# Patient Record
Sex: Female | Born: 1965 | Race: Black or African American | Hispanic: No | Marital: Single | Smoking: Current every day smoker
Health system: Southern US, Community
[De-identification: ages and names within clinical notes are randomized; demographics above are authoritative.]

## PROBLEM LIST (undated history)

## (undated) DIAGNOSIS — J45909 Unspecified asthma, uncomplicated: Secondary | ICD-10-CM

## (undated) DIAGNOSIS — E785 Hyperlipidemia, unspecified: Secondary | ICD-10-CM

## (undated) DIAGNOSIS — J449 Chronic obstructive pulmonary disease, unspecified: Secondary | ICD-10-CM

## (undated) DIAGNOSIS — I10 Essential (primary) hypertension: Secondary | ICD-10-CM

## (undated) DIAGNOSIS — J302 Other seasonal allergic rhinitis: Secondary | ICD-10-CM

## (undated) HISTORY — DX: Hyperlipidemia, unspecified: E78.5

## (undated) HISTORY — DX: Chronic obstructive pulmonary disease, unspecified: J44.9

## (undated) HISTORY — PX: NO PAST SURGERIES: SHX2092

## (undated) HISTORY — DX: Unspecified asthma, uncomplicated: J45.909

---

## 2019-02-22 ENCOUNTER — Other Ambulatory Visit: Payer: Self-pay

## 2019-02-22 ENCOUNTER — Emergency Department (HOSPITAL_COMMUNITY)
Admission: EM | Admit: 2019-02-22 | Discharge: 2019-02-23 | Disposition: A | Discharge status: Home / Self Care | Payer: Self-pay | Attending: Emergency Medicine | Admitting: Emergency Medicine

## 2019-02-22 ENCOUNTER — Emergency Department (HOSPITAL_COMMUNITY): Payer: Self-pay

## 2019-02-22 ENCOUNTER — Encounter (HOSPITAL_COMMUNITY): Payer: Self-pay | Admitting: Emergency Medicine

## 2019-02-22 DIAGNOSIS — R079 Chest pain, unspecified: Secondary | ICD-10-CM | POA: Insufficient documentation

## 2019-02-22 DIAGNOSIS — F17210 Nicotine dependence, cigarettes, uncomplicated: Secondary | ICD-10-CM | POA: Insufficient documentation

## 2019-02-22 LAB — BASIC METABOLIC PANEL
Anion gap: 11 (ref 5–15)
BUN: 13 mg/dL (ref 6–20)
CO2: 18 mmol/L — ABNORMAL LOW (ref 22–32)
Calcium: 8.2 mg/dL — ABNORMAL LOW (ref 8.9–10.3)
Chloride: 107 mmol/L (ref 98–111)
Creatinine, Ser: 0.86 mg/dL (ref 0.44–1.00)
GFR calc Af Amer: >60 mL/min (ref >60)
GFR calc non Af Amer: >60 mL/min (ref >60)
Glucose, Bld: 137 mg/dL — ABNORMAL HIGH (ref 70–99)
Potassium: 4.1 mmol/L (ref 3.5–5.1)
Sodium: 136 mmol/L (ref 135–145)

## 2019-02-22 LAB — CBC
HCT: 45.8 % (ref 36.0–46.0)
Hemoglobin: 14.5 g/dL (ref 12.0–15.0)
MCH: 25.1 pg — ABNORMAL LOW (ref 26.0–34.0)
MCHC: 31.7 g/dL (ref 30.0–36.0)
MCV: 79.2 fL — ABNORMAL LOW (ref 80.0–100.0)
Platelets: 256 10*3/uL (ref 150–400)
RBC: 5.78 MIL/uL — ABNORMAL HIGH (ref 3.87–5.11)
RDW: 14.2 % (ref 11.5–15.5)
WBC: 7.5 10*3/uL (ref 4.0–10.5)
nRBC: 0 % (ref 0.0–0.2)

## 2019-02-22 LAB — TROPONIN I (HIGH SENSITIVITY): Troponin I (High Sensitivity): 4 ng/L (ref <18)

## 2019-02-22 LAB — I-STAT BETA HCG BLOOD, ED (MC, WL, AP ONLY): I-stat hCG, quantitative: <5 m[IU]/mL (ref <5)

## 2019-02-22 MED ORDER — SODIUM CHLORIDE 0.9% FLUSH: 3.0000 mL | Freq: Once | INTRAVENOUS | Status: DC

## 2019-02-22 NOTE — ED Triage Notes (Signed)
Pt in with c/o L side cp, worse since this am. States she is being treated currently for bronchitis with inhaler. Endorses cough and n/v, denies fevers. Pain "soreness" when pressed

## 2019-02-22 NOTE — ED Notes (Signed)
Pt c/o headache. Pt informed medication cannot be given prior to being roomed. Pt had no further questions.

## 2019-02-23 ENCOUNTER — Other Ambulatory Visit: Payer: Self-pay

## 2019-02-23 MED ORDER — LISINOPRIL-HYDROCHLOROTHIAZIDE 20-12.5 MG PO TABS: 1.0000 | ORAL_TABLET | Freq: Every day | ORAL | 0 refills | Status: DC

## 2019-02-23 MED ORDER — OMEPRAZOLE 20 MG PO CPDR: 20.0000 mg | DELAYED_RELEASE_CAPSULE | Freq: Every day | ORAL | 0 refills | Status: DC

## 2019-02-23 NOTE — ED Notes (Signed)
Pt discharged from ED; instructions provided and scripts given; Pt encouraged to return to ED if symptoms worsen and to f/u with PCP; Pt verbalized understanding of all instructions 

## 2019-02-23 NOTE — ED Provider Notes (Signed)
[facility] EMERGENCY DEPARTMENT Provider Note   CSN: [id number] Arrival date & time: 02/22/19  1743     History   Chief Complaint Chief Complaint  Patient presents with  . Bronchitis  . Cough  . Chest Pain    HPI Carrie Potts is a 53 y.o. female.     Patient to ED with complaint of pain in her left chest x 3 days. She describes pain that comes and goes, felt more when she moves or is active. She is using an inhaler and nebulizer for treatment of chronic bronchitis and is using these medications as per her usual without any complaint of new or worsening shortness of breath. No cough, fever, nausea, diaphoresis. The pain in her chest does not radiate.   The history is provided by the patient. No language interpreter was used.  Cough Associated symptoms: chest pain   Associated symptoms: no chills, no fever and no shortness of breath   Chest Pain Associated symptoms: no abdominal pain, no cough, no fever, no nausea and no shortness of breath     History reviewed. No pertinent past medical history.  There are no active problems to display for this patient.   History reviewed. No pertinent surgical history.   OB History   No obstetric history on file.      Home Medications    Prior to Admission medications   Not on File    Family History No family history on file.  Social History Social History   Tobacco Use  . Smoking status: Current Every Day Smoker    Packs/day: 0.50  . Smokeless tobacco: Never Used  Substance Use Topics  . Alcohol use: Not Currently  . Drug use: Never     Allergies   Patient has no allergy information on record.   Review of Systems Review of Systems  Constitutional: Negative for chills and fever.  HENT: Negative.   Respiratory: Negative for cough and shortness of breath.   Cardiovascular: Positive for chest pain.  Gastrointestinal: Negative.  Negative for abdominal pain and nausea.   Musculoskeletal: Negative.   Skin: Negative.   Neurological: Negative.  Negative for light-headedness.     Physical Exam Updated Vital Signs BP 132/88   Pulse 78   Temp 98.3 F (36.8 C) (Oral)   Resp 17   Ht 5\' 2"  (1.575 m)   Wt 79.8 kg   SpO2 100%   BMI 32.19 kg/m   Physical Exam Constitutional:      Appearance: She is well-developed.  HENT:     Head: Normocephalic.  Neck:     Musculoskeletal: Normal range of motion and neck supple.  Cardiovascular:     Rate and Rhythm: Normal rate and regular rhythm.  Pulmonary:     Effort: Pulmonary effort is normal.     Breath sounds: Normal breath sounds. No wheezing, rhonchi or rales.  Chest:     Chest wall: Tenderness (Tenderness that reproduces pain of complaint in left chest. ) present.  Abdominal:     General: Bowel sounds are normal.     Palpations: Abdomen is soft.     Tenderness: There is no abdominal tenderness. There is no guarding or rebound.  Musculoskeletal: Normal range of motion.  Skin:    General: Skin is warm and dry.     Findings: No rash.  Neurological:     General: No focal deficit present.     Mental Status: She is alert and oriented to person,  place, and time.      ED Treatments / Results  Labs (all labs ordered are listed, but only abnormal results are displayed) Labs Reviewed  BASIC METABOLIC PANEL - Abnormal; Notable for the following components:      Result Value   CO2 18 (*)    Glucose, Bld 137 (*)    Calcium 8.2 (*)    All other components within normal limits  CBC - Abnormal; Notable for the following components:   RBC 5.78 (*)    MCV 79.2 (*)    MCH 25.1 (*)    All other components within normal limits  I-STAT BETA HCG BLOOD, ED ([facility], [facility], [facility] ONLY)  TROPONIN I (HIGH SENSITIVITY)  TROPONIN I (HIGH SENSITIVITY)    EKG None  Radiology Dg Chest 2 View  Result Date: 02/22/2019 CLINICAL DATA:  Pt in with c/o L side cp, worse since this am. States she is being treated currently for  bronchitis with inhaler. Endorses cough and n/v, denies fevers. EXAM: CHEST - 2 VIEW COMPARISON:  None. FINDINGS: The heart size and mediastinal contours are within normal limits. The lungs are clear. No pneumothorax or pleural effusion. The visualized skeletal structures are unremarkable. IMPRESSION: No active cardiopulmonary disease. Electronically Signed   By: Audie Pinto M.D.   On: 02/22/2019 19:00    Procedures Procedures (including critical care time)  Medications Ordered in ED Medications  sodium chloride flush (NS) 0.9 % injection 3 mL (has no administration in time range)     Initial Impression / Assessment and Plan / ED Course  I have reviewed the triage vital signs and the nursing notes.  Pertinent labs & imaging results that were available during my care of the patient were reviewed by me and considered in my medical decision making (see chart for details).        Patient to ED with 3 days of chest pain in left chest, worse with movement/activity, no SOB, nausea, cough, fever.   She is very well appearing. Labs are unremarkable including troponin. CXR clear. EKG NSR without ischemia. Pain x 3 days that is atypical for ACS. Delta troponin is not felt necessary for full evaluation.   Pain is reproducible. GI source still on the differential given left sided pain, however, less likely.   Discussed trial of Prilosec and PCP follow up if pain continues. She is requesting refill of her Zestoretic which we will do for a 30 day supply. Will refer to primary care clinic as she is new to the area.   Patient is felt appropriate for discharge home.   Final Clinical Impressions(s) / ED Diagnoses   Final diagnoses:  None   1. Nonspecific chest pain  ED Discharge Orders    None       Charlann Lange, PA-C 02/23/19 0543    Merryl Hacker, MD 02/24/19 [phone]

## 2019-05-13 HISTORY — PX: COLONOSCOPY: SHX174

## 2019-05-18 ENCOUNTER — Other Ambulatory Visit: Payer: Self-pay

## 2019-05-18 ENCOUNTER — Encounter (INDEPENDENT_AMBULATORY_CARE_PROVIDER_SITE_OTHER): Payer: Self-pay | Admitting: Primary Care

## 2019-05-18 ENCOUNTER — Ambulatory Visit (INDEPENDENT_AMBULATORY_CARE_PROVIDER_SITE_OTHER): Payer: BC Managed Care – PPO | Admitting: Primary Care

## 2019-05-18 VITALS — BP 158/90 | HR 85 | Temp 97.3°F | Ht 62.0 in | Wt 179.0 lb

## 2019-05-18 DIAGNOSIS — M545 Low back pain, unspecified: Secondary | ICD-10-CM

## 2019-05-18 DIAGNOSIS — I10 Essential (primary) hypertension: Secondary | ICD-10-CM | POA: Diagnosis not present

## 2019-05-18 DIAGNOSIS — Z1211 Encounter for screening for malignant neoplasm of colon: Secondary | ICD-10-CM

## 2019-05-18 DIAGNOSIS — Z13220 Encounter for screening for lipoid disorders: Secondary | ICD-10-CM | POA: Diagnosis not present

## 2019-05-18 DIAGNOSIS — E785 Hyperlipidemia, unspecified: Secondary | ICD-10-CM

## 2019-05-18 DIAGNOSIS — F17200 Nicotine dependence, unspecified, uncomplicated: Secondary | ICD-10-CM

## 2019-05-18 DIAGNOSIS — Z114 Encounter for screening for human immunodeficiency virus [HIV]: Secondary | ICD-10-CM

## 2019-05-18 DIAGNOSIS — Z23 Encounter for immunization: Secondary | ICD-10-CM | POA: Diagnosis not present

## 2019-05-18 DIAGNOSIS — Z1231 Encounter for screening mammogram for malignant neoplasm of breast: Secondary | ICD-10-CM

## 2019-05-18 DIAGNOSIS — G8929 Other chronic pain: Secondary | ICD-10-CM

## 2019-05-18 MED ORDER — SIMVASTATIN 10 MG PO TABS: 10.0000 mg | ORAL_TABLET | Freq: Every day | ORAL | 1 refills | Status: DC

## 2019-05-18 MED ORDER — LISINOPRIL-HYDROCHLOROTHIAZIDE 20-25 MG PO TABS: 1.0000 | ORAL_TABLET | Freq: Every day | ORAL | 3 refills | Status: DC

## 2019-05-18 MED ORDER — GABAPENTIN 600 MG PO TABS: 600.0000 mg | ORAL_TABLET | Freq: Three times a day (TID) | ORAL | 3 refills | Status: DC

## 2019-05-18 MED ORDER — LORATADINE 10 MG PO TABS: 10.0000 mg | ORAL_TABLET | Freq: Every day | ORAL | 3 refills | Status: DC

## 2019-05-18 NOTE — Progress Notes (Signed)
Established Patient Office Visit  Subjective:  Patient ID: Carrie Potts, female    DOB: 03-28-1966  Age: 54 y.o. MRN: [id number]  CC:  Chief Complaint  Patient presents with  . New Patient (Initial Visit)    HTN/eczema   . Medication Refill    HPI Carrie Potts presents for  Establishment of care and the management of hypertension , hyperlipidemia and bronchitis . She denies shortness of breath, headaches, chest pain or lower extremity edema. Voices concerns about eczema on her face.  History reviewed. No pertinent past medical history.  History reviewed. No pertinent surgical history.  History reviewed. No pertinent family history.  Social History   Socioeconomic History  . Marital status: Single    Spouse name: Not on file  . Number of children: Not on file  . Years of education: Not on file  . Highest education level: Not on file  Occupational History  . Not on file  Tobacco Use  . Smoking status: Current Every Day Smoker    Packs/day: 0.50  . Smokeless tobacco: Never Used  Substance and Sexual Activity  . Alcohol use: Not Currently  . Drug use: Never  . Sexual activity: Not on file  Other Topics Concern  . Not on file  Social History Narrative  . Not on file   Social Determinants of Health   Financial Resource Strain:   . Difficulty of Paying Living Expenses: Not on file  Food Insecurity:   . Worried About Charity fundraiser in the Last Year: Not on file  . Ran Out of Food in the Last Year: Not on file  Transportation Needs:   . Lack of Transportation (Medical): Not on file  . Lack of Transportation (Non-Medical): Not on file  Physical Activity:   . Days of Exercise per Week: Not on file  . Minutes of Exercise per Session: Not on file  Stress:   . Feeling of Stress : Not on file  Social Connections:   . Frequency of Communication with Friends and Family: Not on file  . Frequency of Social Gatherings with Friends and Family: Not  on file  . Attends Religious Services: Not on file  . Active Member of Clubs or Organizations: Not on file  . Attends Archivist Meetings: Not on file  . Marital Status: Not on file  Intimate Partner Violence:   . Fear of Current or Ex-Partner: Not on file  . Emotionally Abused: Not on file  . Physically Abused: Not on file  . Sexually Abused: Not on file    Outpatient Medications Prior to Visit  Medication Sig Dispense Refill  . gabapentin (NEURONTIN) 600 MG tablet Take 600 mg by mouth 3 (three) times daily. Take 1 tablet in the morning, 1 tablet in the evening and 2 tablets in the evening for back pain    . lisinopril-hydrochlorothiazide (ZESTORETIC) 20-12.5 MG tablet Take 1 tablet by mouth daily. 30 tablet 0  . loratadine (CLARITIN) 10 MG tablet Take 10 mg by mouth at bedtime.    . simvastatin (ZOCOR) 10 MG tablet Take 10 mg by mouth daily.    Marland Kitchen omeprazole (PRILOSEC) 20 MG capsule Take 1 capsule (20 mg total) by mouth daily. 14 capsule 0   No facility-administered medications prior to visit.    No Known Allergies  ROS Review of Systems  Respiratory: Positive for shortness of breath and wheezing.        With exertion   Musculoskeletal: Positive  for back pain and joint swelling.       Hands  All other systems reviewed and are negative.     Objective:    Physical Exam  Constitutional: She is oriented to person, place, and time. She appears well-developed and well-nourished.  HENT:  Head: Normocephalic.  Eyes: Pupils are equal, round, and reactive to light. EOM are normal.  Cardiovascular: Normal rate and regular rhythm.  Pulmonary/Chest: Effort normal and breath sounds normal.  Abdominal: Soft. Bowel sounds are normal.  Musculoskeletal:        General: Normal range of motion.     Cervical back: Normal range of motion and neck supple.  Neurological: She is oriented to person, place, and time. She has normal reflexes.  Skin: Skin is warm and dry.   Psychiatric: She has a normal mood and affect. Her behavior is normal.    BP (!) 158/90 (BP Location: Right Arm, Patient Position: Sitting, Cuff Size: Normal)   Pulse 85   Temp (!) 97.3 F (36.3 C) (Temporal)   Ht _0  (1.575 m)   Wt 179 lb (81.2 kg)   SpO2 98%   BMI 32.74 kg/m  Wt Readings from Last 3 Encounters:  05/18/19 179 lb (81.2 kg)  02/23/19 176 lb (79.8 kg)     Health Maintenance Due  Topic Date Due  . PAP SMEAR-Modifier  08/04/1988  . MAMMOGRAM  08/04/2017  . COLONOSCOPY  08/04/2017    There are no preventive care reminders to display for this patient.  No results found for: TSH Lab Results  Component Value Date   WBC 6.3 05/18/2019   HGB 13.7 05/18/2019   HCT 43.0 05/18/2019   MCV 78 (L) 05/18/2019   PLT 292 05/18/2019   Lab Results  Component Value Date   NA 139 05/18/2019   K 3.9 05/18/2019   CO2 20 05/18/2019   GLUCOSE 87 05/18/2019   BUN 15 05/18/2019   CREATININE 0.65 05/18/2019   BILITOT <0.2 05/18/2019   ALKPHOS 69 05/18/2019   AST 23 05/18/2019   ALT 24 05/18/2019   PROT 6.2 05/18/2019   ALBUMIN 4.2 05/18/2019   CALCIUM 9.2 05/18/2019   ANIONGAP 11 02/22/2019   Lab Results  Component Value Date   CHOL 187 05/18/2019   Lab Results  Component Value Date   HDL 54 05/18/2019   Lab Results  Component Value Date   LDLCALC 97 05/18/2019   Lab Results  Component Value Date   TRIG 210 (H) 05/18/2019   Lab Results  Component Value Date   CHOLHDL 3.5 05/18/2019   No results found for: HGBA1C    Assessment & Plan:  Carrie Potts was seen today for new patient (initial visit) and medication refill.  Diagnoses and all orders for this visit:  Need for Tdap vaccination -     Tdap vaccine greater than or equal to 7yo IM  Encounter for screening for HIV -     HIV antibody (with reflex)  Screening mammogram, encounter for -     MM Digital Screening; Future  Encounter for screening for malignant neoplasm of colon -     Cancel: CT  VIRTUAL COLONOSCOPY DIAGNOSTIC; Future -     Ambulatory referral to Gastroenterology  Essential hypertension Counseled on blood pressure goal of less than 130/80, low-sodium, DASH diet, medication compliance, 150 minutes of moderate intensity exercise per week. Discussed medication compliance, adverse effects. -     CBC with Differential -     CMP14+EGFR  Hyperlipidemia,  unspecified hyperlipidemia type Elevated cholesterol that can lead to heart attack and stroke.Decrease your fatty foods, red meat, cheese, milk and increase fiber like whole grains and veggies.    Lipid Panel  Chronic bilateral low back pain, unspecified whether sciatica present gabapentin (NEURONTIN) 600 MG tablet; Take 1 tablet (600 mg total) by mouth 3 (three) times daily. Take 1 tablet in the morning, 1 tablet in the evening and 2 tablets in the evening for back pain  Need for immunization against influenza -     Flu Vaccine QUAD 36+ mos IM  Other orders -     lisinopril-hydrochlorothiazide (ZESTORETIC) 20-25 MG tablet; Take 1 tablet by mouth daily. -     loratadine (CLARITIN) 10 MG tablet; Take 1 tablet (10 mg total) by mouth at bedtime. -     Discontinue: simvastatin (ZOCOR) 10 MG tablet; Take 1 tablet (10 mg total) by mouth daily. -       Meds ordered this encounter  Medications  . lisinopril-hydrochlorothiazide (ZESTORETIC) 20-25 MG tablet    Sig: Take 1 tablet by mouth daily.    Dispense:  90 tablet    Refill:  3  . loratadine (CLARITIN) 10 MG tablet    Sig: Take 1 tablet (10 mg total) by mouth at bedtime.    Dispense:  90 tablet    Refill:  3  . DISCONTD: simvastatin (ZOCOR) 10 MG tablet    Sig: Take 1 tablet (10 mg total) by mouth daily.    Dispense:  90 tablet    Refill:  1  . gabapentin (NEURONTIN) 600 MG tablet    Sig: Take 1 tablet (600 mg total) by mouth 3 (three) times daily. Take 1 tablet in the morning, 1 tablet in the evening and 2 tablets in the evening for back pain    Dispense:  150  tablet    Refill:  3    Follow-up: Return in 3 weeks (on 06/08/2019) for 3 weeks tele visit for blood pressure .    Kerin Perna, NP

## 2019-05-18 NOTE — Patient Instructions (Signed)
   Managing Your Hypertension Hypertension is commonly called high blood pressure. This is when the force of your blood pressing against the walls of your arteries is too strong. Arteries are blood vessels that carry blood from your heart throughout your body. Hypertension forces the heart to work harder to pump blood, and may cause the arteries to become narrow or stiff. Having untreated or uncontrolled hypertension can cause heart attack, stroke, kidney disease, and other problems. What are blood pressure readings? A blood pressure reading consists of a higher number over a lower number. Ideally, your blood pressure should be below 120/80. The first ("top") number is called the systolic pressure. It is a measure of the pressure in your arteries as your heart beats. The second ("bottom") number is called the diastolic pressure. It is a measure of the pressure in your arteries as the heart relaxes. What does my blood pressure reading mean? Blood pressure is classified into four stages. Based on your blood pressure reading, your health care provider may use the following stages to determine what type of treatment you need, if any. Systolic pressure and diastolic pressure are measured in a unit called mm Hg. Normal  Systolic pressure: below 120.  Diastolic pressure: below 80. Elevated  Systolic pressure: 120-129.  Diastolic pressure: below 80. Hypertension stage 1  Systolic pressure: 130-139.  Diastolic pressure: 80-89. Hypertension stage 2  Systolic pressure: 140 or above.  Diastolic pressure: 90 or above. What health risks are associated with hypertension? Managing your hypertension is an important responsibility. Uncontrolled hypertension can lead to:  A heart attack.  A stroke.  A weakened blood vessel (aneurysm).  Heart failure.  Kidney damage.  Eye damage.  Metabolic syndrome.  Memory and concentration problems. What changes can I make to manage my  hypertension? Hypertension can be managed by making lifestyle changes and possibly by taking medicines. Your health care provider will help you make a plan to bring your blood pressure within a normal range. Eating and drinking   Eat a diet that is high in fiber and potassium, and low in salt (sodium), added sugar, and fat. An example eating plan is called the DASH (Dietary Approaches to Stop Hypertension) diet. To eat this way: ? Eat plenty of fresh fruits and vegetables. Try to fill half of your plate at each meal with fruits and vegetables. ? Eat whole grains, such as whole wheat pasta, brown rice, or whole grain bread. Fill about one quarter of your plate with whole grains. ? Eat low-fat diary products. ? Avoid fatty cuts of meat, processed or cured meats, and poultry with skin. Fill about one quarter of your plate with lean proteins such as fish, chicken without skin, beans, eggs, and tofu. ? Avoid premade and processed foods. These tend to be higher in sodium, added sugar, and fat.  Reduce your daily sodium intake. Most people with hypertension should eat less than 1,500 mg of sodium a day.  Limit alcohol intake to no more than 1 drink a day for nonpregnant women and 2 drinks a day for men. One drink equals 12 oz of beer, 5 oz of wine, or 1 oz of hard liquor. Lifestyle  Work with your health care provider to maintain a healthy body weight, or to lose weight. Ask what an ideal weight is for you.  Get at least 30 minutes of exercise that causes your heart to beat faster (aerobic exercise) most days of the week. Activities may include walking, swimming, or biking.    Include exercise to strengthen your muscles (resistance exercise), such as weight lifting, as part of your weekly exercise routine. Try to do these types of exercises for 30 minutes at least 3 days a week.  Do not use any products that contain nicotine or tobacco, such as cigarettes and e-cigarettes. If you need help quitting,  ask your health care provider.  Control any long-term (chronic) conditions you have, such as high cholesterol or diabetes. Monitoring  Monitor your blood pressure at home as told by your health care provider. Your personal target blood pressure may vary depending on your medical conditions, your age, and other factors.  Have your blood pressure checked regularly, as often as told by your health care provider. Working with your health care provider  Review all the medicines you take with your health care provider because there may be side effects or interactions.  Talk with your health care provider about your diet, exercise habits, and other lifestyle factors that may be contributing to hypertension.  Visit your health care provider regularly. Your health care provider can help you create and adjust your plan for managing hypertension. Will I need medicine to control my blood pressure? Your health care provider may prescribe medicine if lifestyle changes are not enough to get your blood pressure under control, and if:  Your systolic blood pressure is 130 or higher.  Your diastolic blood pressure is 80 or higher. Take medicines only as told by your health care provider. Follow the directions carefully. Blood pressure medicines must be taken as prescribed. The medicine does not work as well when you skip doses. Skipping doses also puts you at risk for problems. Contact a health care provider if:  You think you are having a reaction to medicines you have taken.  You have repeated (recurrent) headaches.  You feel dizzy.  You have swelling in your ankles.  You have trouble with your vision. Get help right away if:  You develop a severe headache or confusion.  You have unusual weakness or numbness, or you feel faint.  You have severe pain in your chest or abdomen.  You vomit repeatedly.  You have trouble breathing. Summary  Hypertension is when the force of blood pumping  through your arteries is too strong. If this condition is not controlled, it may put you at risk for serious complications.  Your personal target blood pressure may vary depending on your medical conditions, your age, and other factors. For most people, a normal blood pressure is less than 120/80.  Hypertension is managed by lifestyle changes, medicines, or both. Lifestyle changes include weight loss, eating a healthy, low-sodium diet, exercising more, and limiting alcohol. This information is not intended to replace advice given to you by your health care provider. Make sure you discuss any questions you have with your health care provider. Document Revised: 08/20/2018 Document Reviewed: 03/26/2016 Elsevier Patient Education  2020 [employer]  

## 2019-05-19 ENCOUNTER — Other Ambulatory Visit (INDEPENDENT_AMBULATORY_CARE_PROVIDER_SITE_OTHER): Payer: Self-pay | Admitting: Primary Care

## 2019-05-19 ENCOUNTER — Telehealth (INDEPENDENT_AMBULATORY_CARE_PROVIDER_SITE_OTHER): Payer: Self-pay

## 2019-05-19 LAB — CBC WITH DIFFERENTIAL/PLATELET
Basophils Absolute: 0.1 10*3/uL (ref 0.0–0.2)
Basos: 1 %
EOS (ABSOLUTE): 0.1 10*3/uL (ref 0.0–0.4)
Eos: 2 %
Hematocrit: 43 % (ref 34.0–46.6)
Hemoglobin: 13.7 g/dL (ref 11.1–15.9)
Immature Grans (Abs): 0 10*3/uL (ref 0.0–0.1)
Immature Granulocytes: 0 %
Lymphocytes Absolute: 2.4 10*3/uL (ref 0.7–3.1)
Lymphs: 39 %
MCH: 24.9 pg — ABNORMAL LOW (ref 26.6–33.0)
MCHC: 31.9 g/dL (ref 31.5–35.7)
MCV: 78 fL — ABNORMAL LOW (ref 79–97)
Monocytes Absolute: 0.5 10*3/uL (ref 0.1–0.9)
Monocytes: 7 %
Neutrophils Absolute: 3.2 10*3/uL (ref 1.4–7.0)
Neutrophils: 51 %
Platelets: 292 10*3/uL (ref 150–450)
RBC: 5.51 x10E6/uL — ABNORMAL HIGH (ref 3.77–5.28)
RDW: 13.3 % (ref 11.7–15.4)
WBC: 6.3 10*3/uL (ref 3.4–10.8)

## 2019-05-19 LAB — HIV ANTIBODY (ROUTINE TESTING W REFLEX): HIV Screen 4th Generation wRfx: NONREACTIVE

## 2019-05-19 LAB — CMP14+EGFR
ALT: 24 IU/L (ref 0–32)
AST: 23 IU/L (ref 0–40)
Albumin/Globulin Ratio: 2.1 (ref 1.2–2.2)
Albumin: 4.2 g/dL (ref 3.8–4.9)
Alkaline Phosphatase: 69 IU/L (ref 39–117)
BUN/Creatinine Ratio: 23 (ref 9–23)
BUN: 15 mg/dL (ref 6–24)
Bilirubin Total: <0.2 mg/dL (ref 0.0–1.2)
CO2: 20 mmol/L (ref 20–29)
Calcium: 9.2 mg/dL (ref 8.7–10.2)
Chloride: 105 mmol/L (ref 96–106)
Creatinine, Ser: 0.65 mg/dL (ref 0.57–1.00)
GFR calc Af Amer: 119 mL/min/{1.73_m2} (ref >59)
GFR calc non Af Amer: 103 mL/min/{1.73_m2} (ref >59)
Globulin, Total: 2 g/dL (ref 1.5–4.5)
Glucose: 87 mg/dL (ref 65–99)
Potassium: 3.9 mmol/L (ref 3.5–5.2)
Sodium: 139 mmol/L (ref 134–144)
Total Protein: 6.2 g/dL (ref 6.0–8.5)

## 2019-05-19 LAB — LIPID PANEL
Chol/HDL Ratio: 3.5 ratio (ref 0.0–4.4)
Cholesterol, Total: 187 mg/dL (ref 100–199)
HDL: 54 mg/dL (ref >39)
LDL Chol Calc (NIH): 97 mg/dL (ref 0–99)
Triglycerides: 210 mg/dL — ABNORMAL HIGH (ref 0–149)
VLDL Cholesterol Cal: 36 mg/dL (ref 5–40)

## 2019-05-19 MED ORDER — SIMVASTATIN 20 MG PO TABS: 20.0000 mg | ORAL_TABLET | Freq: Every day | ORAL | 1 refills | Status: DC

## 2019-05-19 NOTE — Telephone Encounter (Signed)
Patient is aware of Elevated Triglycerides increased simvastatin 20mg  at bedtime. Sent in new prescriptions. Take 2 pills until current bottle is empty. Other labs reviewed normal and HIV negative. She verbalized understanding of results. Nat Christen, CMA

## 2019-05-19 NOTE — Telephone Encounter (Signed)
-----   Message from Kerin Perna, NP sent at 05/19/2019  9:20 AM EST ----- Elevated Triglycerides increased simvastatin 20mg  at bedtime. Sent in new prescriptions. Take 2 pills until current bottle is empty. Other labs reviewed normal and HIV negative

## 2019-06-01 ENCOUNTER — Other Ambulatory Visit (INDEPENDENT_AMBULATORY_CARE_PROVIDER_SITE_OTHER): Payer: Self-pay | Admitting: Primary Care

## 2019-06-01 MED ORDER — GABAPENTIN 600 MG PO TABS: ORAL_TABLET | ORAL | 3 refills | Status: DC

## 2019-06-08 ENCOUNTER — Other Ambulatory Visit: Payer: Self-pay

## 2019-06-08 ENCOUNTER — Ambulatory Visit (INDEPENDENT_AMBULATORY_CARE_PROVIDER_SITE_OTHER): Payer: BC Managed Care – PPO | Admitting: Primary Care

## 2019-06-08 ENCOUNTER — Encounter (INDEPENDENT_AMBULATORY_CARE_PROVIDER_SITE_OTHER): Payer: Self-pay | Admitting: Primary Care

## 2019-06-08 DIAGNOSIS — F17200 Nicotine dependence, unspecified, uncomplicated: Secondary | ICD-10-CM | POA: Diagnosis not present

## 2019-06-08 DIAGNOSIS — I10 Essential (primary) hypertension: Secondary | ICD-10-CM | POA: Diagnosis not present

## 2019-06-08 DIAGNOSIS — J9801 Acute bronchospasm: Secondary | ICD-10-CM

## 2019-06-08 MED ORDER — ALBUTEROL SULFATE HFA 108 (90 BASE) MCG/ACT IN AERS: 2.0000 | INHALATION_SPRAY | Freq: Four times a day (QID) | RESPIRATORY_TRACT | 1 refills | Status: DC | PRN

## 2019-06-08 MED ORDER — AMLODIPINE BESYLATE 10 MG PO TABS: 10.0000 mg | ORAL_TABLET | Freq: Every day | ORAL | 1 refills | Status: DC

## 2019-06-08 NOTE — Progress Notes (Signed)
Pt can not find her BP cuff to check her pressure  Pt states claritin is not working

## 2019-06-08 NOTE — Progress Notes (Signed)
Virtual Visit via Telephone Note  I connected with Carrie Potts on 06/08/19 at  8:50 AM EST by telephone and verified that I am speaking with the correct person using two identifiers.   I discussed the limitations, risks, security and privacy concerns of performing an evaluation and management service by telephone and the availability of in person appointments. I also discussed with the patient that there may be a patient responsible charge related to this service. The patient expressed understanding and agreed to proceed.   History of Present Illness: Carrie Potts is having a tele visit first time taken Bp today 149/97 , HR 79 unable to review ranges.   No past medical history on file.  Current Outpatient Medications on File Prior to Visit  Medication Sig Dispense Refill  . gabapentin (NEURONTIN) 600 MG tablet Take 1 tablet at breakfast 1 tablet at noon and 2 tablets at bedtime 120 tablet 3  . lisinopril-hydrochlorothiazide (ZESTORETIC) 20-25 MG tablet Take 1 tablet by mouth daily. 90 tablet 3  . loratadine (CLARITIN) 10 MG tablet Take 1 tablet (10 mg total) by mouth at bedtime. 90 tablet 3  . simvastatin (ZOCOR) 20 MG tablet Take 1 tablet (20 mg total) by mouth daily. 90 tablet 1   No current facility-administered medications on file prior to visit.   Observations/Objective: Review of Systems  Respiratory: Positive for cough, sputum production, shortness of breath and wheezing.   All other systems reviewed and are negative.   Assessment and Plan: Carrie Potts was seen today for blood pressure check.  Diagnoses and all orders for this visit:  Essential hypertension Uncontrolled elevated today only reading available takes medication daily. Added amlodipin 10 mg daily and continue lisinopril/HCTZ 20/25   Tobacco dependence She was made aware of increased risk for lung cancer and other respiratory diseases recommend cessation. Also increasing bronchitis  exacerbation/bronchospasm This will be reminded at each clinical visit.  Bronchospasm Productive Coughing , short of breath and wheezing emphasize smoking is coughing increase exacerbation . Prescribed a rescue inhaler and instruction when and hough to use  Other orders -     amLODipine (NORVASC) 10 MG tablet; Take 1 tablet (10 mg total) by mouth daily. -     albuterol (VENTOLIN HFA) 108 (90 Base) MCG/ACT inhaler; Inhale 2 puffs into the lungs every 6 (six) hours as needed for wheezing or shortness of breath.    Follow Up Instructions:    I discussed the assessment and treatment plan with the patient. The patient was provided an opportunity to ask questions and all were answered. The patient agreed with the plan and demonstrated an understanding of the instructions.   The patient was advised to call back or seek an in-person evaluation if the symptoms worsen or if the condition fails to improve as anticipated.  I provided 13 minutes of non-face-to-face time during this encounter.   Kerin Perna, NP

## 2019-06-11 ENCOUNTER — Other Ambulatory Visit: Payer: Self-pay

## 2019-06-11 ENCOUNTER — Ambulatory Visit (HOSPITAL_COMMUNITY)
Admission: EM | Admit: 2019-06-11 | Discharge: 2019-06-11 | Disposition: A | Discharge status: Home / Self Care | Payer: BC Managed Care – PPO | Attending: Urgent Care | Admitting: Urgent Care

## 2019-06-11 ENCOUNTER — Encounter (HOSPITAL_COMMUNITY): Payer: Self-pay

## 2019-06-11 DIAGNOSIS — J4541 Moderate persistent asthma with (acute) exacerbation: Secondary | ICD-10-CM | POA: Insufficient documentation

## 2019-06-11 DIAGNOSIS — Z20822 Contact with and (suspected) exposure to covid-19: Secondary | ICD-10-CM | POA: Insufficient documentation

## 2019-06-11 DIAGNOSIS — R0602 Shortness of breath: Secondary | ICD-10-CM | POA: Diagnosis not present

## 2019-06-11 DIAGNOSIS — F17200 Nicotine dependence, unspecified, uncomplicated: Secondary | ICD-10-CM | POA: Diagnosis not present

## 2019-06-11 DIAGNOSIS — R062 Wheezing: Secondary | ICD-10-CM | POA: Insufficient documentation

## 2019-06-11 MED ORDER — AEROCHAMBER PLUS FLO-VU LARGE MISC
Status: AC
  Filled 2019-06-11: qty 1

## 2019-06-11 MED ORDER — ALBUTEROL SULFATE HFA 108 (90 BASE) MCG/ACT IN AERS
INHALATION_SPRAY | RESPIRATORY_TRACT | Status: AC
  Filled 2019-06-11: qty 6.7

## 2019-06-11 MED ORDER — METHYLPREDNISOLONE ACETATE 80 MG/ML IJ SUSP
INTRAMUSCULAR | Status: AC
  Filled 2019-06-11: qty 1

## 2019-06-11 MED ORDER — AEROCHAMBER PLUS FLO-VU MEDIUM MISC
1.0000 | Freq: Once | Status: AC
  Administered 2019-06-11: 17:00:00 1

## 2019-06-11 MED ORDER — ALBUTEROL SULFATE HFA 108 (90 BASE) MCG/ACT IN AERS
1.0000 | INHALATION_SPRAY | Freq: Once | RESPIRATORY_TRACT | Status: AC
  Administered 2019-06-11: 1 via RESPIRATORY_TRACT

## 2019-06-11 MED ORDER — NEBULIZER/TUBING/MOUTHPIECE KIT: 1.0000 | PACK | 1 refills | Status: DC | PRN

## 2019-06-11 MED ORDER — METHYLPREDNISOLONE ACETATE 80 MG/ML IJ SUSP
80.0000 mg | Freq: Once | INTRAMUSCULAR | Status: AC
  Administered 2019-06-11: 80 mg via INTRAMUSCULAR

## 2019-06-11 NOTE — ED Triage Notes (Signed)
Pt presents to UC with asthma flare up, SOB, and cough x 1 week. Pt reports the inhaler is not giving any relief.

## 2019-06-11 NOTE — ED Provider Notes (Signed)
[place]   MRN: [id number] DOB: [birth year]  Subjective:   Carrie Potts is a 54 y.o. female presenting for 1 week hx of acute onset persistent shob, wheezing, chest tightness. She is using her inhaler without relief. Has nebulizer but needs a mask.  COVID-19 testing about 2 weeks ago was negative. Denies direct COVID 19 contacts/exposure.   No current facility-administered medications for this encounter.  Current Outpatient Medications:  .  albuterol (VENTOLIN HFA) 108 (90 Base) MCG/ACT inhaler, Inhale 2 puffs into the lungs every 6 (six) hours as needed for wheezing or shortness of breath., Disp: 6.7 g, Rfl: 1 .  amLODipine (NORVASC) 10 MG tablet, Take 1 tablet (10 mg total) by mouth daily., Disp: 90 tablet, Rfl: 1 .  gabapentin (NEURONTIN) 600 MG tablet, Take 1 tablet at breakfast 1 tablet at noon and 2 tablets at bedtime, Disp: 120 tablet, Rfl: 3 .  lisinopril-hydrochlorothiazide (ZESTORETIC) 20-25 MG tablet, Take 1 tablet by mouth daily., Disp: 90 tablet, Rfl: 3 .  loratadine (CLARITIN) 10 MG tablet, Take 1 tablet (10 mg total) by mouth at bedtime., Disp: 90 tablet, Rfl: 3 .  simvastatin (ZOCOR) 20 MG tablet, Take 1 tablet (20 mg total) by mouth daily., Disp: 90 tablet, Rfl: 1   No Known Allergies  History reviewed. No pertinent past medical history.   History reviewed. No pertinent surgical history.  History reviewed. No pertinent family history.  Social History   Tobacco Use  . Smoking status: Current Every Day Smoker    Packs/day: 0.50  . Smokeless tobacco: Never Used  Substance Use Topics  . Alcohol use: Not Currently  . Drug use: Never    Review of Systems  Constitutional: Positive for malaise/fatigue. Negative for fever.  HENT: Negative for congestion, ear pain, sinus pain and sore throat.   Eyes: Negative for discharge and redness.  Respiratory: Positive for shortness of breath and wheezing. Negative for cough and hemoptysis.    Cardiovascular: Negative for chest pain.  Gastrointestinal: Negative for abdominal pain, diarrhea, nausea and vomiting.  Genitourinary: Negative for dysuria, flank pain and hematuria.  Musculoskeletal: Negative for myalgias.  Skin: Negative for rash.  Neurological: Negative for dizziness, weakness and headaches.  Psychiatric/Behavioral: Negative for depression and substance abuse.     Objective:   Vitals: BP 134/74 (BP Location: Left Arm)   Pulse 96   Temp 98.8 F (37.1 C) (Oral)   Resp (!) 24   SpO2 95%   Physical Exam Constitutional:      General: She is not in acute distress.    Appearance: Normal appearance. She is well-developed. She is not ill-appearing, toxic-appearing or diaphoretic.  HENT:     Head: Normocephalic and atraumatic.     Nose: Nose normal.     Mouth/Throat:     Mouth: Mucous membranes are moist.  Eyes:     Extraocular Movements: Extraocular movements intact.     Pupils: Pupils are equal, round, and reactive to light.  Cardiovascular:     Rate and Rhythm: Normal rate and regular rhythm.     Pulses: Normal pulses.     Heart sounds: Normal heart sounds. No murmur. No friction rub. No gallop.   Pulmonary:     Effort: Pulmonary effort is normal. No respiratory distress.     Breath sounds: No stridor. Examination of the right-upper field reveals wheezing. Examination of the left-upper field reveals wheezing. Examination of the right-middle field reveals wheezing. Examination of the left-middle field reveals wheezing. Examination of the  right-lower field reveals wheezing. Examination of the left-lower field reveals wheezing. Wheezing present. No rhonchi or rales.  Skin:    General: Skin is warm and dry.     Findings: No rash.  Neurological:     Mental Status: She is alert and oriented to person, place, and time.  Psychiatric:        Mood and Affect: Mood normal.        Behavior: Behavior normal.        Thought Content: Thought content normal.       Assessment and Plan :   1. Moderate persistent asthma with exacerbation   2. Shortness of breath   3. Wheezing   4. Smoker   5. Exposure to COVID-19 virus     Patient is high risk due to her asthma, smoking. IM Depomedrol, albuterol with spacer given in clinic. Provided with script for nebulizer mask and tubing. COVID 19 testing pending. Counseled patient on potential for adverse effects with medications prescribed/recommended today, strict ER and return-to-clinic precautions discussed, patient verbalized understanding.    Jaynee Eagles, [place] 06/11/19 1712

## 2019-06-12 ENCOUNTER — Encounter (HOSPITAL_COMMUNITY): Payer: Self-pay

## 2019-06-12 LAB — NOVEL CORONAVIRUS, NAA (HOSP ORDER, SEND-OUT TO REF LAB; TAT 18-24 HRS): SARS-CoV-2, NAA: NOT DETECTED

## 2019-06-22 ENCOUNTER — Ambulatory Visit (INDEPENDENT_AMBULATORY_CARE_PROVIDER_SITE_OTHER): Payer: BC Managed Care – PPO | Admitting: Primary Care

## 2019-06-22 ENCOUNTER — Other Ambulatory Visit: Payer: Self-pay

## 2019-06-22 ENCOUNTER — Other Ambulatory Visit (HOSPITAL_COMMUNITY)
Admission: RE | Admit: 2019-06-22 | Discharge: 2019-06-22 | Disposition: A | Discharge status: Home / Self Care | Payer: BC Managed Care – PPO | Source: Ambulatory Visit | Attending: Primary Care | Admitting: Primary Care

## 2019-06-22 ENCOUNTER — Encounter (INDEPENDENT_AMBULATORY_CARE_PROVIDER_SITE_OTHER): Payer: Self-pay | Admitting: Primary Care

## 2019-06-22 VITALS — BP 148/80 | HR 69 | Temp 90.5°F | Ht 62.0 in | Wt 176.2 lb

## 2019-06-22 DIAGNOSIS — Z0000 Encounter for general adult medical examination without abnormal findings: Secondary | ICD-10-CM | POA: Diagnosis not present

## 2019-06-22 DIAGNOSIS — N898 Other specified noninflammatory disorders of vagina: Secondary | ICD-10-CM

## 2019-06-22 DIAGNOSIS — Z124 Encounter for screening for malignant neoplasm of cervix: Secondary | ICD-10-CM

## 2019-06-22 DIAGNOSIS — R87612 Low grade squamous intraepithelial lesion on cytologic smear of cervix (LGSIL): Secondary | ICD-10-CM | POA: Diagnosis not present

## 2019-06-22 DIAGNOSIS — A5909 Other urogenital trichomoniasis: Secondary | ICD-10-CM | POA: Diagnosis not present

## 2019-06-22 DIAGNOSIS — Z1231 Encounter for screening mammogram for malignant neoplasm of breast: Secondary | ICD-10-CM | POA: Diagnosis not present

## 2019-06-22 DIAGNOSIS — I10 Essential (primary) hypertension: Secondary | ICD-10-CM

## 2019-06-22 DIAGNOSIS — F17200 Nicotine dependence, unspecified, uncomplicated: Secondary | ICD-10-CM

## 2019-06-22 DIAGNOSIS — Z1211 Encounter for screening for malignant neoplasm of colon: Secondary | ICD-10-CM

## 2019-06-22 NOTE — Progress Notes (Signed)
Pt complains that when she tries to have sex it is painful

## 2019-06-22 NOTE — Progress Notes (Signed)
Established Patient Office Visit  Subjective:  Patient ID: Carrie Potts, female    DOB: [birth year]  Age: 54 y.o. MRN: [id number]  CC:  Chief Complaint  Patient presents with  . Annual Exam  . Gynecologic Exam    HPI [name] presents for annual and well woman gyn visit. Complains of painful sexual intercourse denies dryness.  History reviewed. No pertinent past medical history.  History reviewed. No pertinent surgical history.  History reviewed. No pertinent family history.  Social History   Socioeconomic History  . Marital status: Single    Spouse name: Not on file  . Number of children: Not on file  . Years of education: Not on file  . Highest education level: Not on file  Occupational History  . Not on file  Tobacco Use  . Smoking status: Current Every Day Smoker    Packs/day: 0.50  . Smokeless tobacco: Never Used  Substance and Sexual Activity  . Alcohol use: Not Currently  . Drug use: Never  . Sexual activity: Not on file  Other Topics Concern  . Not on file  Social History Narrative  . Not on file   Social Determinants of Health   Financial Resource Strain:   . Difficulty of Paying Living Expenses: Not on file  Food Insecurity:   . Worried About Charity fundraiser in the Last Year: Not on file  . Ran Out of Food in the Last Year: Not on file  Transportation Needs:   . Lack of Transportation (Medical): Not on file  . Lack of Transportation (Non-Medical): Not on file  Physical Activity:   . Days of Exercise per Week: Not on file  . Minutes of Exercise per Session: Not on file  Stress:   . Feeling of Stress : Not on file  Social Connections:   . Frequency of Communication with Friends and Family: Not on file  . Frequency of Social Gatherings with Friends and Family: Not on file  . Attends Religious Services: Not on file  . Active Member of Clubs or Organizations: Not on file  . Attends Archivist Meetings: Not on  file  . Marital Status: Not on file  Intimate Partner Violence:   . Fear of Current or Ex-Partner: Not on file  . Emotionally Abused: Not on file  . Physically Abused: Not on file  . Sexually Abused: Not on file    Outpatient Medications Prior to Visit  Medication Sig Dispense Refill  . amLODipine (NORVASC) 10 MG tablet Take 1 tablet (10 mg total) by mouth daily. 90 tablet 1  . lisinopril-hydrochlorothiazide (ZESTORETIC) 20-25 MG tablet Take 1 tablet by mouth daily. 90 tablet 3  . loratadine (CLARITIN) 10 MG tablet Take 1 tablet (10 mg total) by mouth at bedtime. 90 tablet 3  . Respiratory Therapy Supplies (NEBULIZER/TUBING/MOUTHPIECE) KIT 1 kit by Does not apply route every 4 (four) hours as needed. 1 kit 1  . simvastatin (ZOCOR) 20 MG tablet Take 1 tablet (20 mg total) by mouth daily. 90 tablet 1  . albuterol (VENTOLIN HFA) 108 (90 Base) MCG/ACT inhaler Inhale 2 puffs into the lungs every 6 (six) hours as needed for wheezing or shortness of breath. 6.7 g 1  . gabapentin (NEURONTIN) 600 MG tablet Take 1 tablet at breakfast 1 tablet at noon and 2 tablets at bedtime (Patient not taking: Reported on 06/22/2019) 120 tablet 3   No facility-administered medications prior to visit.    No Known Allergies  ROS Review of Systems  Genitourinary: Positive for vaginal pain.       With sexual intercourse   All other systems reviewed and are negative.     Objective:    Physical Exam CONSTITUTIONAL: Well-developed, well-nourished female in no acute distress.  HENT:  Normocephalic, atraumatic, External right and left ear normal. Oropharynx is clear and moist EYES: Conjunctivae and EOM are normal. Pupils are equal, round, and reactive to light. No scleral icterus.  NECK: Normal range of motion, supple, no masses.  Normal thyroid.  SKIN: Skin is warm and dry. No rash noted. Not diaphoretic. No erythema. No pallor. [place]: Alert and oriented to person, place, and time. Normal reflexes, muscle  tone coordination. No cranial nerve deficit noted. PSYCHIATRIC: Normal mood and affect. Normal behavior. Normal judgment and thought content. CARDIOVASCULAR: Normal heart rate noted, regular rhythm RESPIRATORY: Clear to auscultation bilaterally. Effort and breath sounds normal, no problems with respiration noted. BREASTS: Symmetric in size. No masses, skin changes, nipple drainage, or lymphadenopathy. ABDOMEN: Soft, normal bowel sounds, no distention noted.  No tenderness, rebound or guarding.  PELVIC: Normal appearing external genitalia; normal appearing vaginal mucosa and cervix.  No abnormal discharge noted.  Pap smear obtained.  Normal uterine size, no other palpable masses, no uterine or adnexal tenderness. MUSCULOSKELETAL: Normal range of motion. No tenderness.  No cyanosis, clubbing, or edema.  2+ distal pulses.  BP (!) 148/80 (BP Location: Right Arm, Patient Position: Sitting, Cuff Size: Normal)   Pulse 69   Temp (!) 90.5 F (32.5 C) (Temporal)   Ht 5' 2" (1.575 m)   Wt 176 lb 3.2 oz (79.9 kg)   SpO2 96%   BMI 32.23 kg/m  Wt Readings from Last 3 Encounters:  06/22/19 176 lb 3.2 oz (79.9 kg)  05/18/19 179 lb (81.2 kg)  02/23/19 176 lb (79.8 kg)     Health Maintenance Due  Topic Date Due  . PAP SMEAR-Modifier  08/04/1988  . COLONOSCOPY  08/04/2017    There are no preventive care reminders to display for this patient.  No results found for: TSH Lab Results  Component Value Date   WBC 6.3 05/18/2019   HGB 13.7 05/18/2019   HCT 43.0 05/18/2019   MCV 78 (L) 05/18/2019   PLT 292 05/18/2019   Lab Results  Component Value Date   NA 139 05/18/2019   K 3.9 05/18/2019   CO2 20 05/18/2019   GLUCOSE 87 05/18/2019   BUN 15 05/18/2019   CREATININE 0.65 05/18/2019   BILITOT <0.2 05/18/2019   ALKPHOS 69 05/18/2019   AST 23 05/18/2019   ALT 24 05/18/2019   PROT 6.2 05/18/2019   ALBUMIN 4.2 05/18/2019   CALCIUM 9.2 05/18/2019   ANIONGAP 11 02/22/2019   Lab Results   Component Value Date   CHOL 187 05/18/2019   Lab Results  Component Value Date   HDL 54 05/18/2019   Lab Results  Component Value Date   LDLCALC 97 05/18/2019   Lab Results  Component Value Date   TRIG 210 (H) 05/18/2019   Lab Results  Component Value Date   CHOLHDL 3.5 05/18/2019   No results found for: HGBA1C    Assessment & Plan:  Cheril was seen today for annual exam and gynecologic exam.  Diagnoses and all orders for this visit:  Vaginal discharge -     Cervicovaginal ancillary only  Cervical cancer screening -     Cytology - PAP([place])  Encounter for screening mammogram for malignant neoplasm  of breast Recommended screening at age 64 yearly for early detection of breast cancer . In this encounter taught [facility] -     [facility] MAMMOGRAPHY  Colon cancer screening Normal colon cancer screening.  CDC recommends colorectal screening from ages 46-75.  -     Ambulatory referral to Gastroenterology Call Dr. Collene Mares office to reschedule appointment  Tobacco dependence She is made aware of increased risk for lung cancer and other respiratory diseases recommend cessation.  This will be reminded at each clinical visit.  Essential hypertension Elevated at this visit was not aware she could take both Bp amlodipine 89m and lisinopril-HCTZ 20/25 . Counseled on blood pressure goal of less than 130/80, low-sodium, DASH diet, medication compliance, 150 minutes of moderate intensity exercise per week.   No orders of the defined types were placed in this encounter.   Follow-up: Return in about 6 months (around 12/20/2019) for HTN .    MKerin Perna NP

## 2019-06-22 NOTE — Patient Instructions (Signed)
Normal colon cancer screening.  CDC recommends colorectal screening from ages 42-75. Screening can begin at 66 or earlier in some cases. This screening is used for a disease when no symptoms are present . Diagnostic test is used for symptoms examples blood in stool, colorectal polyps or coloector cancer, family history or inflammatory bowel disease - chron's or ulcerative colitis .(USPSTF) Dr Collene Mares-

## 2019-06-23 LAB — CERVICOVAGINAL ANCILLARY ONLY
Bacterial Vaginitis (gardnerella): NEGATIVE
Candida Glabrata: NEGATIVE
Candida Vaginitis: NEGATIVE
Chlamydia: NEGATIVE
Comment: NEGATIVE
Comment: NEGATIVE
Comment: NEGATIVE
Comment: NEGATIVE
Comment: NEGATIVE
Comment: NORMAL
Neisseria Gonorrhea: NEGATIVE
Trichomonas: POSITIVE — AB

## 2019-06-24 ENCOUNTER — Other Ambulatory Visit (INDEPENDENT_AMBULATORY_CARE_PROVIDER_SITE_OTHER): Payer: Self-pay | Admitting: Primary Care

## 2019-06-24 DIAGNOSIS — R87612 Low grade squamous intraepithelial lesion on cytologic smear of cervix (LGSIL): Secondary | ICD-10-CM

## 2019-06-24 LAB — CYTOLOGY - PAP

## 2019-06-27 ENCOUNTER — Ambulatory Visit (INDEPENDENT_AMBULATORY_CARE_PROVIDER_SITE_OTHER): Payer: BC Managed Care – PPO

## 2019-06-28 ENCOUNTER — Emergency Department (HOSPITAL_COMMUNITY): Payer: BC Managed Care – PPO

## 2019-06-28 ENCOUNTER — Emergency Department (HOSPITAL_COMMUNITY)
Admission: EM | Admit: 2019-06-28 | Discharge: 2019-06-28 | Disposition: A | Discharge status: Home / Self Care | Payer: BC Managed Care – PPO | Attending: Emergency Medicine | Admitting: Emergency Medicine

## 2019-06-28 ENCOUNTER — Ambulatory Visit (INDEPENDENT_AMBULATORY_CARE_PROVIDER_SITE_OTHER): Payer: BC Managed Care – PPO | Admitting: Primary Care

## 2019-06-28 ENCOUNTER — Encounter (INDEPENDENT_AMBULATORY_CARE_PROVIDER_SITE_OTHER): Payer: Self-pay

## 2019-06-28 ENCOUNTER — Other Ambulatory Visit: Payer: Self-pay

## 2019-06-28 ENCOUNTER — Ambulatory Visit (INDEPENDENT_AMBULATORY_CARE_PROVIDER_SITE_OTHER): Payer: BC Managed Care – PPO

## 2019-06-28 ENCOUNTER — Encounter (HOSPITAL_COMMUNITY): Payer: Self-pay | Admitting: Emergency Medicine

## 2019-06-28 VITALS — BP 138/80 | HR 85 | Wt 178.0 lb

## 2019-06-28 DIAGNOSIS — F17200 Nicotine dependence, unspecified, uncomplicated: Secondary | ICD-10-CM | POA: Diagnosis not present

## 2019-06-28 DIAGNOSIS — I10 Essential (primary) hypertension: Secondary | ICD-10-CM | POA: Diagnosis not present

## 2019-06-28 DIAGNOSIS — M25551 Pain in right hip: Secondary | ICD-10-CM | POA: Diagnosis not present

## 2019-06-28 DIAGNOSIS — Z1211 Encounter for screening for malignant neoplasm of colon: Secondary | ICD-10-CM | POA: Diagnosis not present

## 2019-06-28 DIAGNOSIS — Z79899 Other long term (current) drug therapy: Secondary | ICD-10-CM | POA: Insufficient documentation

## 2019-06-28 DIAGNOSIS — Z800 Family history of malignant neoplasm of digestive organs: Secondary | ICD-10-CM | POA: Diagnosis not present

## 2019-06-28 DIAGNOSIS — A599 Trichomoniasis, unspecified: Secondary | ICD-10-CM

## 2019-06-28 DIAGNOSIS — M25511 Pain in right shoulder: Secondary | ICD-10-CM | POA: Diagnosis not present

## 2019-06-28 HISTORY — DX: Essential (primary) hypertension: I10

## 2019-06-28 MED ORDER — METHOCARBAMOL 500 MG PO TABS: 500.0000 mg | ORAL_TABLET | Freq: Two times a day (BID) | ORAL | 0 refills | Status: AC

## 2019-06-28 MED ORDER — METRONIDAZOLE 500 MG PO TABS
2000.0000 mg | ORAL_TABLET | Freq: Once | ORAL | Status: AC
  Administered 2019-06-28: 2000 mg via ORAL

## 2019-06-28 MED ORDER — ACETAMINOPHEN 325 MG PO TABS
650.0000 mg | ORAL_TABLET | Freq: Once | ORAL | Status: AC
  Administered 2019-06-28: 650 mg via ORAL
  Filled 2019-06-28: qty 2

## 2019-06-28 MED ORDER — METHOCARBAMOL 500 MG PO TABS
500.0000 mg | ORAL_TABLET | Freq: Once | ORAL | Status: AC
  Administered 2019-06-28: 500 mg via ORAL
  Filled 2019-06-28: qty 1
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
## 2019-06-28 NOTE — Progress Notes (Signed)
Pt tolerated treatment well.
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
## 2019-06-28 NOTE — ED Notes (Signed)
Pt verbalized understanding of d/c instructions, s/s requiring return to ed and follow up care. Pt had no additional questions at this time

## 2019-07-06 ENCOUNTER — Ambulatory Visit (INDEPENDENT_AMBULATORY_CARE_PROVIDER_SITE_OTHER): Payer: BC Managed Care – PPO | Admitting: Primary Care

## 2019-07-13 DIAGNOSIS — Z800 Family history of malignant neoplasm of digestive organs: Secondary | ICD-10-CM | POA: Diagnosis not present

## 2019-07-13 DIAGNOSIS — K633 Ulcer of intestine: Secondary | ICD-10-CM | POA: Diagnosis not present

## 2019-07-13 DIAGNOSIS — D490 Neoplasm of unspecified behavior of digestive system: Secondary | ICD-10-CM | POA: Diagnosis not present

## 2019-07-13 DIAGNOSIS — Z1211 Encounter for screening for malignant neoplasm of colon: Secondary | ICD-10-CM | POA: Diagnosis not present

## 2019-07-13 DIAGNOSIS — K6389 Other specified diseases of intestine: Secondary | ICD-10-CM | POA: Diagnosis not present

## 2019-12-20 ENCOUNTER — Ambulatory Visit (INDEPENDENT_AMBULATORY_CARE_PROVIDER_SITE_OTHER): Payer: BC Managed Care – PPO | Admitting: Primary Care

## 2020-01-17 ENCOUNTER — Other Ambulatory Visit: Payer: Self-pay

## 2020-01-17 ENCOUNTER — Ambulatory Visit (HOSPITAL_COMMUNITY)
Admission: EM | Admit: 2020-01-17 | Discharge: 2020-01-17 | Disposition: A | Discharge status: Home / Self Care | Payer: BC Managed Care – PPO | Attending: Family Medicine | Admitting: Family Medicine

## 2020-01-17 ENCOUNTER — Encounter (HOSPITAL_COMMUNITY): Payer: Self-pay

## 2020-01-17 DIAGNOSIS — M5431 Sciatica, right side: Secondary | ICD-10-CM | POA: Diagnosis not present

## 2020-01-17 MED ORDER — PREDNISONE 20 MG PO TABS: 40.0000 mg | ORAL_TABLET | Freq: Every day | ORAL | 0 refills | Status: DC

## 2020-01-17 NOTE — Discharge Instructions (Addendum)

## 2020-01-17 NOTE — ED Triage Notes (Signed)
Pt is here with on & off back pain for years now, states the pain has went to her right leg, pt has taken NSAIDS to relieve discomfort.

## 2020-01-18 NOTE — ED Provider Notes (Signed)
[facility]   [id number] 01/17/20 Arrival Time: 7673  ASSESSMENT & PLAN:  1. Sciatica of right side      Able to ambulate here and hemodynamically stable. No indication for imaging of back at this time given no trauma and normal neurological exam. Discussed.  Begin trial of: Meds ordered this encounter  Medications  . predniSONE (DELTASONE) 20 MG tablet    Sig: Take 2 tablets (40 mg total) by mouth daily.    Dispense:  10 tablet    Refill:  0    Encourage ROM/movement as tolerated.  Recommend:  Follow-up Information    [place].   Why: If worsening or failing to improve as anticipated. Contact information: [address] [place] [phone]              Reviewed expectations re: course of current medical issues. Questions answered. Outlined signs and symptoms indicating need for more acute intervention. Patient verbalized understanding. After Visit Summary given.   SUBJECTIVE: History from: patient.  [facility] is a 54 y.o. female who presents with acute on chronic R low back pain that radiates into R leg at times. Current exacerbation several days. No injury/trauma. NSAIDs without much relief. No extremity sensation changes or weakness. Normal bowel/bladder habits.  Reports no chronic steroid use, fevers, IV drug use, or recent back surgeries or procedures.  ROS: As per HPI. All other systems negative.   OBJECTIVE:  Vitals:   01/17/20 1530  BP: 138/84  Pulse: 80  Resp: 18  Temp: 98.6 F (37 C)  TempSrc: Oral  SpO2: 100%    General appearance: alert; no distress HEENT: [place]; AT Neck: supple with FROM; without midline tenderness CV: regular Lungs: unlabored respirations; speaks full sentences without difficulty Abdomen: soft, non-tender; non-distended Back: moderate and poorly localized tenderness over R lower back/upper buttock; + R SLR; FROM at waist;  bruising: none; without midline tenderness Extremities: without edema; symmetrical without gross deformities; normal ROM of bilateral LE Skin: warm and dry Neurologic: normal gait; normal sensation and strength of bilateral LE Psychological: alert and cooperative; normal mood and affect    No Known Allergies  Past Medical History:  Diagnosis Date  . Hypertension    Social History   Socioeconomic History  . Marital status: Single    Spouse name: Not on file  . Number of children: Not on file  . Years of education: Not on file  . Highest education level: Not on file  Occupational History  . Not on file  Tobacco Use  . Smoking status: Current Every Day Smoker    Packs/day: 0.50    Types: Cigarettes  . Smokeless tobacco: Never Used  Substance and Sexual Activity  . Alcohol use: Not Currently  . Drug use: Never  . Sexual activity: Not Currently    Birth control/protection: None  Other Topics Concern  . Not on file  Social History Narrative  . Not on file   Social Determinants of Health   Financial Resource Strain:   . Difficulty of Paying Living Expenses: Not on file  Food Insecurity:   . Worried About Charity fundraiser in the Last Year: Not on file  . Ran Out of Food in the Last Year: Not on file  Transportation Needs:   . Lack of Transportation (Medical): Not on file  . Lack of Transportation (Non-Medical): Not on file  Physical Activity:   . Days of Exercise per  Week: Not on file  . Minutes of Exercise per Session: Not on file  Stress:   . Feeling of Stress : Not on file  Social Connections:   . Frequency of Communication with Friends and Family: Not on file  . Frequency of Social Gatherings with Friends and Family: Not on file  . Attends Religious Services: Not on file  . Active Member of Clubs or Organizations: Not on file  . Attends Archivist Meetings: Not on file  . Marital Status: Not on file  Intimate Partner Violence:   . Fear of Current  or Ex-Partner: Not on file  . Emotionally Abused: Not on file  . Physically Abused: Not on file  . Sexually Abused: Not on file   Family History  Problem Relation Age of Onset  . Healthy Mother    History reviewed. No pertinent surgical history.   Vanessa Kick, MD 01/18/20 [phone]

## 2020-01-24 ENCOUNTER — Other Ambulatory Visit: Payer: Self-pay

## 2020-01-24 ENCOUNTER — Encounter (HOSPITAL_COMMUNITY): Payer: Self-pay | Admitting: Emergency Medicine

## 2020-01-24 ENCOUNTER — Ambulatory Visit (HOSPITAL_COMMUNITY)
Admission: EM | Admit: 2020-01-24 | Discharge: 2020-01-24 | Disposition: A | Discharge status: Home / Self Care | Payer: BC Managed Care – PPO | Attending: Emergency Medicine | Admitting: Emergency Medicine

## 2020-01-24 DIAGNOSIS — M79604 Pain in right leg: Secondary | ICD-10-CM

## 2020-01-24 DIAGNOSIS — M5441 Lumbago with sciatica, right side: Secondary | ICD-10-CM | POA: Diagnosis not present

## 2020-01-24 MED ORDER — KETOROLAC TROMETHAMINE 30 MG/ML IJ SOLN
30.0000 mg | Freq: Once | INTRAMUSCULAR | Status: AC
  Administered 2020-01-24: 30 mg via INTRAMUSCULAR

## 2020-01-24 MED ORDER — METHYLPREDNISOLONE ACETATE 80 MG/ML IJ SUSP
80.0000 mg | Freq: Once | INTRAMUSCULAR | Status: AC
  Administered 2020-01-24: 80 mg via INTRAMUSCULAR

## 2020-01-24 MED ORDER — METHYLPREDNISOLONE ACETATE 80 MG/ML IJ SUSP
INTRAMUSCULAR | Status: AC
  Filled 2020-01-24: qty 1

## 2020-01-24 MED ORDER — KETOROLAC TROMETHAMINE 30 MG/ML IJ SOLN
INTRAMUSCULAR | Status: AC
  Filled 2020-01-24: qty 1

## 2020-01-24 MED ORDER — NAPROXEN 500 MG PO TABS: 500.0000 mg | ORAL_TABLET | Freq: Two times a day (BID) | ORAL | 0 refills | Status: DC

## 2020-01-24 MED ORDER — TIZANIDINE HCL 4 MG PO TABS: 4.0000 mg | ORAL_TABLET | Freq: Four times a day (QID) | ORAL | 0 refills | Status: DC | PRN

## 2020-01-24 NOTE — ED Triage Notes (Signed)
Patient presents to [facility] for assessment of continued right leg pain.  Was seen last week, given prednisone.  States it is not helping/working.

## 2020-01-24 NOTE — Discharge Instructions (Signed)
We gave you an injection of Toradol and Depo-Medrol today for your back pain Continue with Naprosyn twice daily with food beginning tomorrow May supplement with tizanidine which is a muscle relaxer, do not drive or work after taking Gentle stretching-see attached Follow-up with sports medicine/physical therapy if persisting

## 2020-01-24 NOTE — ED Provider Notes (Signed)
[place]    CSN: [id number] Arrival date & time: 01/24/20  1203      History   Chief Complaint Chief Complaint  Patient presents with   Leg Pain    HPI Carrie Potts is a 54 y.o. female history of hypertension presenting today for evaluation of back and leg pain.  Patient reports over the past 1 to 2 days she has had pain in her lower back radiating into her right leg.  Back pain is improved, but continues to have a lot of pain in her leg.  Thought symptoms worsened.  Seen here approximately 1 week ago and was given course of prednisone.  History of similar.  Denies injury or trauma.  Denies any urinary symptoms.  Denies loss of control of bowel/bladder.  Denies saddle anesthesia.  Also using ibuprofen.  Reports some stomach irritation  but using both medicine and ibuprofen together.  HPI  Past Medical History:  Diagnosis Date   Hypertension     There are no problems to display for this patient.   History reviewed. No pertinent surgical history.  OB History   No obstetric history on file.      Home Medications    Prior to Admission medications   Medication Sig Start Date End Date Taking? Authorizing Provider  albuterol (VENTOLIN HFA) 108 (90 Base) MCG/ACT inhaler Inhale 2 puffs into the lungs every 6 (six) hours as needed for wheezing or shortness of breath. 06/08/19   Kerin Perna, NP  amLODipine (NORVASC) 10 MG tablet Take 1 tablet (10 mg total) by mouth daily. 06/08/19   Kerin Perna, NP  lisinopril-hydrochlorothiazide (ZESTORETIC) 20-25 MG tablet Take 1 tablet by mouth daily. 05/18/19   Kerin Perna, NP  loratadine (CLARITIN) 10 MG tablet Take 1 tablet (10 mg total) by mouth at bedtime. 05/18/19   Kerin Perna, NP  naproxen (NAPROSYN) 500 MG tablet Take 1 tablet (500 mg total) by mouth 2 (two) times daily. 01/24/20   Ski Polich, Elesa Hacker, PA-C  Respiratory Therapy Supplies (NEBULIZER/TUBING/MOUTHPIECE) KIT 1 kit by Does not  apply route every 4 (four) hours as needed. 06/11/19   Jaynee Eagles, PA-C  simvastatin (ZOCOR) 20 MG tablet Take 1 tablet (20 mg total) by mouth daily. 05/19/19   Kerin Perna, NP  tiZANidine (ZANAFLEX) 4 MG tablet Take 1 tablet (4 mg total) by mouth every 6 (six) hours as needed for muscle spasms. 01/24/20   Ruhama Lehew C, PA-C  gabapentin (NEURONTIN) 600 MG tablet Take 1 tablet at breakfast 1 tablet at noon and 2 tablets at bedtime Patient not taking: Reported on 06/22/2019 06/01/19 01/17/20  Kerin Perna, NP    Family History Family History  Problem Relation Age of Onset   Healthy Mother     Social History Social History   Tobacco Use   Smoking status: Current Every Day Smoker    Packs/day: 0.50    Types: Cigarettes   Smokeless tobacco: Never Used  Substance Use Topics   Alcohol use: Not Currently   Drug use: Never     Allergies   Patient has no known allergies.   Review of Systems Review of Systems  Constitutional: Negative for fatigue and fever.  Eyes: Negative for visual disturbance.  Respiratory: Negative for shortness of breath.   Cardiovascular: Negative for chest pain.  Gastrointestinal: Negative for abdominal pain, nausea and vomiting.  Musculoskeletal: Positive for back pain and gait problem. Negative for arthralgias and joint swelling.  Skin:  Negative for color change, rash and wound.  Neurological: Negative for dizziness, weakness, light-headedness and headaches.     Physical Exam Triage Vital Signs ED Triage Vitals  Enc Vitals Group     BP 01/24/20 1406 (!) 156/87     Pulse Rate 01/24/20 1406 76     Resp 01/24/20 1406 20     Temp 01/24/20 1406 97.9 F (36.6 C)     Temp Source 01/24/20 1406 Oral     SpO2 01/24/20 1406 98 %     Weight --      Height --      Head Circumference --      Peak Flow --      Pain Score 01/24/20 1405 10     Pain Loc --      Pain Edu? --      Excl. in [place]? --    No data found.  Updated Vital Signs BP  (!) 156/87 (BP Location: Right Arm)    Pulse 76    Temp 97.9 F (36.6 C) (Oral)    Resp 20    SpO2 98%   Visual Acuity Right Eye Distance:   Left Eye Distance:   Bilateral Distance:    Right Eye Near:   Left Eye Near:    Bilateral Near:     Physical Exam Vitals and nursing note reviewed.  Constitutional:      Appearance: She is well-developed.     Comments: No acute distress  HENT:     Head: Normocephalic and atraumatic.     Nose: Nose normal.  Eyes:     Conjunctiva/sclera: Conjunctivae normal.  Cardiovascular:     Rate and Rhythm: Normal rate.  Pulmonary:     Effort: Pulmonary effort is normal. No respiratory distress.  Abdominal:     General: There is no distension.  Musculoskeletal:        General: Normal range of motion.     Cervical back: Neck supple.     Comments: Results bending forward for comfort over exam table, nontender to palpation along lumbar spine midline, increased tenderness throughout lower lumbar and superior sacral area as well as along lateral aspect of proximal upper leg on right, weakly positive straight leg raise on right  Strength of hips and knees 5/5 ankle bilaterally, patellar reflex 2+ bilaterally  Skin:    General: Skin is warm and dry.  Neurological:     Mental Status: She is alert and oriented to person, place, and time.      UC Treatments / Results  Labs (all labs ordered are listed, but only abnormal results are displayed) Labs Reviewed - No data to display  EKG   Radiology No results found.  Procedures Procedures (including critical care time)  Medications Ordered in UC Medications  ketorolac (TORADOL) 30 MG/ML injection 30 mg (has no administration in time range)  methylPREDNISolone acetate (DEPO-MEDROL) injection 80 mg (has no administration in time range)    Initial Impression / Assessment and Plan / UC Course  I have reviewed the triage vital signs and the nursing notes.  Pertinent labs & imaging results that  were available during my care of the patient were reviewed by me and considered in my medical decision making (see chart for details).     Patient previously on NSAIDs as well as course of oral prednisone.  Providing Toradol and Depo-Medrol Medrol IM prior to discharge today and continuing on NSAIDs with muscle relaxers.  Given seeming to fail oral medicines,  recommending physical therapy and follow-up with sports medicine as well as gentle stretching at home.  Exercises provided.  No neuro deficits, no red flags for cauda equina.  No urinary symptoms.  Discussed strict return precautions. Patient verbalized understanding and is agreeable with plan.  Final Clinical Impressions(s) / UC Diagnoses   Final diagnoses:  Right leg pain  Acute right-sided low back pain with right-sided sciatica     Discharge Instructions     We gave you an injection of Toradol and Depo-Medrol today for your back pain Continue with Naprosyn twice daily with food beginning tomorrow May supplement with tizanidine which is a muscle relaxer, do not drive or work after taking Gentle stretching-see attached Follow-up with sports medicine/physical therapy if persisting   ED Prescriptions    Medication Sig Dispense Auth. Provider   naproxen (NAPROSYN) 500 MG tablet Take 1 tablet (500 mg total) by mouth 2 (two) times daily. 30 tablet Pacen Watford C, PA-C   tiZANidine (ZANAFLEX) 4 MG tablet Take 1 tablet (4 mg total) by mouth every 6 (six) hours as needed for muscle spasms. 30 tablet [place] Scheffler, [place] C, PA-C     PDMP not reviewed this encounter.   Janith Lima, PA-C 01/24/20 1507

## 2020-02-04 ENCOUNTER — Ambulatory Visit (HOSPITAL_COMMUNITY)
Admission: EM | Admit: 2020-02-04 | Discharge: 2020-02-04 | Disposition: A | Discharge status: Home / Self Care | Payer: BC Managed Care – PPO | Attending: Family Medicine | Admitting: Family Medicine

## 2020-02-04 ENCOUNTER — Other Ambulatory Visit: Payer: Self-pay

## 2020-02-04 ENCOUNTER — Encounter (HOSPITAL_COMMUNITY): Payer: Self-pay

## 2020-02-04 DIAGNOSIS — Z79899 Other long term (current) drug therapy: Secondary | ICD-10-CM | POA: Insufficient documentation

## 2020-02-04 DIAGNOSIS — I10 Essential (primary) hypertension: Secondary | ICD-10-CM | POA: Insufficient documentation

## 2020-02-04 DIAGNOSIS — R112 Nausea with vomiting, unspecified: Secondary | ICD-10-CM | POA: Diagnosis not present

## 2020-02-04 DIAGNOSIS — Z20822 Contact with and (suspected) exposure to covid-19: Secondary | ICD-10-CM | POA: Diagnosis not present

## 2020-02-04 DIAGNOSIS — J209 Acute bronchitis, unspecified: Secondary | ICD-10-CM | POA: Diagnosis not present

## 2020-02-04 DIAGNOSIS — R197 Diarrhea, unspecified: Secondary | ICD-10-CM | POA: Diagnosis not present

## 2020-02-04 DIAGNOSIS — F17210 Nicotine dependence, cigarettes, uncomplicated: Secondary | ICD-10-CM | POA: Diagnosis not present

## 2020-02-04 DIAGNOSIS — Z791 Long term (current) use of non-steroidal anti-inflammatories (NSAID): Secondary | ICD-10-CM | POA: Diagnosis not present

## 2020-02-04 LAB — SARS CORONAVIRUS 2 (TAT 6-24 HRS): SARS Coronavirus 2: NEGATIVE

## 2020-02-04 MED ORDER — ONDANSETRON 8 MG PO TBDP: 8.0000 mg | ORAL_TABLET | Freq: Three times a day (TID) | ORAL | 0 refills | Status: DC | PRN

## 2020-02-04 MED ORDER — PROMETHAZINE-DM 6.25-15 MG/5ML PO SYRP: 5.0000 mL | ORAL_SOLUTION | Freq: Four times a day (QID) | ORAL | 0 refills | Status: DC | PRN

## 2020-02-04 NOTE — ED Provider Notes (Signed)
[place]    CSN: [id number] Arrival date & time: 02/04/20  1004      History   Chief Complaint Chief Complaint  Patient presents with  . Diarrhea    HPI Carrie Potts is a 54 y.o. female.   Here today with 1 day of N/V/D. Denies fever, chills, congestion, sore throat, abdominal pain, vaginal sxs, dysuria, hematuria, flank pain. No new foods, recent travel, sick contacts, new medications. Has not tried anything OTC for sxs. Does have some hot flashes but is post-menopausal so this is not new per patient.      Past Medical History:  Diagnosis Date  . Hypertension     There are no problems to display for this patient.   History reviewed. No pertinent surgical history.  OB History   No obstetric history on file.      Home Medications    Prior to Admission medications   Medication Sig Start Date End Date Taking? Authorizing Provider  albuterol (VENTOLIN HFA) 108 (90 Base) MCG/ACT inhaler Inhale 2 puffs into the lungs every 6 (six) hours as needed for wheezing or shortness of breath. 06/08/19  Yes Kerin Perna, NP  amLODipine (NORVASC) 10 MG tablet Take 1 tablet (10 mg total) by mouth daily. 06/08/19  Yes Kerin Perna, NP  lisinopril-hydrochlorothiazide (ZESTORETIC) 20-25 MG tablet Take 1 tablet by mouth daily. 05/18/19  Yes Kerin Perna, NP  loratadine (CLARITIN) 10 MG tablet Take 1 tablet (10 mg total) by mouth at bedtime. 05/18/19  Yes Kerin Perna, NP  naproxen (NAPROSYN) 500 MG tablet Take 1 tablet (500 mg total) by mouth 2 (two) times daily. 01/24/20  Yes Wieters, Hallie C, PA-C  Respiratory Therapy Supplies (NEBULIZER/TUBING/MOUTHPIECE) KIT 1 kit by Does not apply route every 4 (four) hours as needed. 06/11/19  Yes Jaynee Eagles, PA-C  simvastatin (ZOCOR) 20 MG tablet Take 1 tablet (20 mg total) by mouth daily. 05/19/19  Yes Kerin Perna, NP  tiZANidine (ZANAFLEX) 4 MG tablet Take 1 tablet (4 mg total) by mouth every 6  (six) hours as needed for muscle spasms. 01/24/20  Yes Wieters, Hallie C, PA-C  ondansetron (ZOFRAN ODT) 8 MG disintegrating tablet Take 1 tablet (8 mg total) by mouth every 8 (eight) hours as needed for nausea or vomiting. 02/04/20   Volney American, PA-C  promethazine-dextromethorphan (PROMETHAZINE-DM) 6.25-15 MG/5ML syrup Take 5 mLs by mouth 4 (four) times daily as needed for cough. 02/04/20   Volney American, PA-C  gabapentin (NEURONTIN) 600 MG tablet Take 1 tablet at breakfast 1 tablet at noon and 2 tablets at bedtime Patient not taking: Reported on 06/22/2019 06/01/19 01/17/20  Kerin Perna, NP    Family History Family History  Problem Relation Age of Onset  . Healthy Mother     Social History Social History   Tobacco Use  . Smoking status: Current Every Day Smoker    Packs/day: 0.50    Types: Cigarettes  . Smokeless tobacco: Never Used  Substance Use Topics  . Alcohol use: Not Currently  . Drug use: Never     Allergies   Patient has no known allergies.   Review of Systems Review of Systems PER HPI   Physical Exam Triage Vital Signs ED Triage Vitals  Enc Vitals Group     BP 02/04/20 1021 132/90     Pulse Rate 02/04/20 1021 92     Resp 02/04/20 1021 16     Temp 02/04/20 1021 97.9  F (36.6 C)     Temp Source 02/04/20 1021 Oral     SpO2 02/04/20 1021 98 %     Weight --      Height --      Head Circumference --      Peak Flow --      Pain Score 02/04/20 1023 2     Pain Loc --      Pain Edu? --      Excl. in [place]? --    No data found.  Updated Vital Signs BP 132/90 (BP Location: Right Arm)   Pulse 92   Temp 97.9 F (36.6 C) (Oral)   Resp 16   SpO2 98%   Visual Acuity Right Eye Distance:   Left Eye Distance:   Bilateral Distance:    Right Eye Near:   Left Eye Near:    Bilateral Near:     Physical Exam Vitals and nursing note reviewed.  Constitutional:      Appearance: Normal appearance. She is not ill-appearing.  HENT:      Head: Atraumatic.     Right Ear: Tympanic membrane normal.     Left Ear: Tympanic membrane normal.     Nose: Nose normal.     Mouth/Throat:     Mouth: Mucous membranes are moist.     Pharynx: Oropharynx is clear. No posterior oropharyngeal erythema.  Eyes:     Extraocular Movements: Extraocular movements intact.     Conjunctiva/sclera: Conjunctivae normal.  Cardiovascular:     Rate and Rhythm: Normal rate and regular rhythm.     Heart sounds: Normal heart sounds.  Pulmonary:     Effort: Pulmonary effort is normal.     Breath sounds: Normal breath sounds.  Abdominal:     General: Bowel sounds are normal. There is no distension.     Palpations: Abdomen is soft.     Tenderness: There is no abdominal tenderness. There is no right CVA tenderness, left CVA tenderness or guarding.  Musculoskeletal:        General: Normal range of motion.     Cervical back: Normal range of motion and neck supple.  Skin:    General: Skin is warm and dry.  Neurological:     Mental Status: She is alert and oriented to person, place, and time.  Psychiatric:        Mood and Affect: Mood normal.        Thought Content: Thought content normal.        Judgment: Judgment normal.     UC Treatments / Results  Labs (all labs ordered are listed, but only abnormal results are displayed) Labs Reviewed  SARS CORONAVIRUS 2 (TAT 6-24 HRS)    EKG   Radiology No results found.  Procedures Procedures (including critical care time)  Medications Ordered in UC Medications - No data to display  Initial Impression / Assessment and Plan / UC Course  I have reviewed the triage vital signs and the nursing notes.  Pertinent labs & imaging results that were available during my care of the patient were reviewed by me and considered in my medical decision making (see chart for details).     Here today with N/V/D x 1 day, currently intolerant to PO. She is afebrile, vitals stable, exam benign and reassuring. Will  test for COVID, this is pending. Note given for isolation until results return, zofran given for prn symptomatic relief and discussed imodium prn. Phenergan DM also given for her ongoing  cough x 1.5 months for which she's already been seen and treated.  Return precautions reviewed.   Final Clinical Impressions(s) / UC Diagnoses   Final diagnoses:  Nausea vomiting and diarrhea  Acute bronchitis, unspecified organism   Discharge Instructions   None    ED Prescriptions    Medication Sig Dispense Auth. Provider   ondansetron (ZOFRAN ODT) 8 MG disintegrating tablet Take 1 tablet (8 mg total) by mouth every 8 (eight) hours as needed for nausea or vomiting. 21 tablet Volney American, [place]   promethazine-dextromethorphan (PROMETHAZINE-DM) 6.25-15 MG/5ML syrup Take 5 mLs by mouth 4 (four) times daily as needed for cough. 100 mL Volney American, [place]     PDMP not reviewed this encounter.   Volney American, [place] 02/04/20 1058

## 2020-02-04 NOTE — ED Triage Notes (Signed)
Pt presents today with NVD that started yesterday. States that she started feeling bad at work and started with diarrhea before she left work. States she woke up at 1-2 am this morning throwing up. States nothing will stay down.

## 2020-04-12 ENCOUNTER — Telehealth: Payer: Self-pay | Admitting: Gastroenterology

## 2020-04-12 NOTE — Telephone Encounter (Signed)
We need a referral from Dr Collene Mares and records placed on Dr [facility] desk for review.

## 2020-04-12 NOTE — Telephone Encounter (Signed)
Called by Dr. Collene Mares saying our office wasn't able to schedule appointment or procedure as of yet. Spoke with my team, we don't have the referral, but I have been told it was sent within the last 3-weeks. I have asked Dr. Collene Mares to have her office resend the referral and information. Let's get her set up for clinic visit in next 2-3 weeks. Will plan to review packet fully when I return to office and then we can proceed with scheduling the colonoscopy attempt. Thanks to all. GM

## 2020-04-12 NOTE — Telephone Encounter (Signed)
The pt has been scheduled for appt on 12/7 at 93 am. Message left for the pt to return call

## 2020-04-13 NOTE — Telephone Encounter (Signed)
Patty, Thank you for getting this scheduled so quickly. I had previously spoken with Dr. 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  I can review the pathology specimen on the paperwork when it arrives on Friday or next week but go ahead and move forward with getting a colonoscopy scheduled for January with a proper 90-minute colonoscopy with EMR time slot, if the patient wants it set up.  If she wants to wait until the clinic visit that is fine as well. Thanks. GM

## 2020-04-16 NOTE — Telephone Encounter (Signed)
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 Her sister had colon cancer as well.

## 2020-04-17 ENCOUNTER — Telehealth: Payer: Self-pay

## 2020-04-17 ENCOUNTER — Encounter: Payer: Self-pay | Admitting: Gastroenterology

## 2020-04-17 ENCOUNTER — Ambulatory Visit (INDEPENDENT_AMBULATORY_CARE_PROVIDER_SITE_OTHER): Payer: BC Managed Care – PPO | Admitting: Gastroenterology

## 2020-04-17 ENCOUNTER — Other Ambulatory Visit (INDEPENDENT_AMBULATORY_CARE_PROVIDER_SITE_OTHER): Payer: BC Managed Care – PPO

## 2020-04-17 VITALS — BP 144/76 | HR 88 | Ht 61.0 in | Wt 159.2 lb

## 2020-04-17 DIAGNOSIS — Z86010 Personal history of colonic polyps: Secondary | ICD-10-CM

## 2020-04-17 DIAGNOSIS — R933 Abnormal findings on diagnostic imaging of other parts of digestive tract: Secondary | ICD-10-CM

## 2020-04-17 DIAGNOSIS — Z800 Family history of malignant neoplasm of digestive organs: Secondary | ICD-10-CM

## 2020-04-17 LAB — BASIC METABOLIC PANEL
BUN: 15 mg/dL (ref 6–23)
CO2: 29 mEq/L (ref 19–32)
Calcium: 9.2 mg/dL (ref 8.4–10.5)
Chloride: 105 mEq/L (ref 96–112)
Creatinine, Ser: 0.87 mg/dL (ref 0.40–1.20)
GFR: 76.45 mL/min (ref >60.00)
Glucose, Bld: 95 mg/dL (ref 70–99)
Potassium: 4.3 mEq/L (ref 3.5–5.1)
Sodium: 139 mEq/L (ref 135–145)

## 2020-04-17 LAB — CBC
HCT: 42.7 % (ref 36.0–46.0)
Hemoglobin: 13.8 g/dL (ref 12.0–15.0)
MCHC: 32.4 g/dL (ref 30.0–36.0)
MCV: 77 fl — ABNORMAL LOW (ref 78.0–100.0)
Platelets: 276 10*3/uL (ref 150.0–400.0)
RBC: 5.54 Mil/uL — ABNORMAL HIGH (ref 3.87–5.11)
RDW: 13.7 % (ref 11.5–15.5)
WBC: 7 10*3/uL (ref 4.0–10.5)

## 2020-04-17 LAB — PROTIME-INR
INR: 1 ratio (ref 0.8–1.0)
Prothrombin Time: 10.8 s (ref 9.6–13.1)

## 2020-04-17 MED ORDER — SUPREP BOWEL PREP KIT 17.5-3.13-1.6 GM/177ML PO SOLN: 1.0000 | ORAL | 0 refills | Status: DC

## 2020-04-17 NOTE — Telephone Encounter (Signed)
Seeing her later today. We will keep you up-to-date with what the final plan is. Thanks. GM

## 2020-04-17 NOTE — Progress Notes (Signed)
[place] VISIT   Primary Care Provider Kerin Perna, NP [place] [id number] [phone]  Referring Provider Juanita Craver  Patient Profile: Carrie Potts is a 54 y.o. female with a pmh significant for COPD/asthma, hypertension, hyperlipidemia, family history colon cancer (early in sister), colon polyp NOS.  The patient presents to the [facility] for an evaluation and management of problem(s) noted below:  Problem List 1. Abnormal colonoscopy   2. History of colon polyps   3. Family history of colon cancer     History of Present Illness This is the patient's first visit to the outpatient [place] clinic.  The patient is an established patient with Dr. Juanita Craver.  The patient was seen in February 2021 in the setting of need for colorectal cancer screening with a sister who had been diagnosed with colon cancer in her 22s.  She had a colonoscopy reportedly at [facility] in 2013 and subsequently had a repeat colonoscopy at [facility] but was told things were normal.  Subsequently she underwent a colonoscopy in March of this year.  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  This was biopsied and returned as ulcerated mucosa and submucosa with granulation tissue.  Multiple fragments showed fibrinopurulent exudate and granulation tissue extending into the submucosal adipose tissue.  No evidence of dysplasia was found.  It is for this reason that the patient is referred for consideration of further endoscopic evaluation and potential resection of this lesion.  The patient missed her earlier appointment today and we rescheduled her to this afternoon.  The patient does not describe any other new symptoms at this time.  She does take nonsteroidals.  She is never had an upper endoscopy.  She is hopeful that she does not have cancer.  She is willing to undergo any  procedures that may help her.  GI Review of Systems Positive as above including issues of nausea and vomiting at times (on medications) Negative for pyrosis, dysphagia, odynophagia hematochezia, melena, fecal urgency  Review of Systems General: Denies fevers/chills/weight loss unintentionally Cardiovascular: Denies chest pain/palpitations Pulmonary: Denies shortness of breath Gastroenterological: See HPI Genitourinary: Denies darkened urine Hematological: Denies easy bruising/bleeding Endocrine: Denies temperature intolerance Dermatological: Denies jaundice Psychological: Mood is anxious   Medications Current Outpatient Medications  Medication Sig Dispense Refill  . albuterol (VENTOLIN HFA) 108 (90 Base) MCG/ACT inhaler Inhale 2 puffs into the lungs every 6 (six) hours as needed for wheezing or shortness of breath. 6.7 g 1  . amLODipine (NORVASC) 10 MG tablet Take 1 tablet (10 mg total) by mouth daily. 90 tablet 1  . gabapentin (NEURONTIN) 600 MG tablet Take 600 mg by mouth as needed.    Marland Kitchen lisinopril-hydrochlorothiazide (ZESTORETIC) 20-25 MG tablet Take 1 tablet by mouth daily. 90 tablet 3  . loratadine (CLARITIN) 10 MG tablet Take 1 tablet (10 mg total) by mouth at bedtime. 90 tablet 3  . naproxen (NAPROSYN) 500 MG tablet Take 1 tablet (500 mg total) by mouth 2 (two) times daily. 30 tablet 0  . ondansetron (ZOFRAN ODT) 8 MG disintegrating tablet Take 1 tablet (8 mg total) by mouth every 8 (eight) hours as needed for nausea or vomiting. 21 tablet 0  . promethazine-dextromethorphan (PROMETHAZINE-DM) 6.25-15 MG/5ML syrup Take 5 mLs by mouth 4 (four) times daily as needed for cough. 100 mL 0  . Respiratory Therapy Supplies (NEBULIZER/TUBING/MOUTHPIECE) KIT 1 kit by Does  not apply route every 4 (four) hours as needed. 1 kit 1  . simvastatin (ZOCOR) 20 MG tablet Take 1 tablet (20 mg total) by mouth daily. 90 tablet 1  . tiZANidine (ZANAFLEX) 4 MG tablet Take 1 tablet (4 mg total) by mouth  every 6 (six) hours as needed for muscle spasms. 30 tablet 0  . Na Sulfate-K Sulfate-Mg Sulf (SUPREP BOWEL PREP KIT) 17.5-3.13-1.6 GM/177ML SOLN Take 1 kit by mouth as directed. For colonoscopy prep 354 mL 0   No current facility-administered medications for this visit.    Allergies No Known Allergies  Histories Past Medical History:  Diagnosis Date  . Asthma   . COPD (chronic obstructive pulmonary disease) ([place])   . HLD (hyperlipidemia)   . Hypertension    Past Surgical History:  Procedure Laterality Date  . NO PAST SURGERIES     Social History   Socioeconomic History  . Marital status: Single    Spouse name: Not on file  . Number of children: 1  . Years of education: Not on file  . Highest education level: Not on file  Occupational History  . Occupation: CNA  Tobacco Use  . Smoking status: Current Every Day Smoker    Packs/day: 0.50    Types: Cigarettes  . Smokeless tobacco: Never Used  Vaping Use  . Vaping Use: Never used  Substance and Sexual Activity  . Alcohol use: Yes    Comment: 3 beers per day  . Drug use: Never  . Sexual activity: Not Currently    Birth control/protection: None  Other Topics Concern  . Not on file  Social History Narrative  . Not on file   Social Determinants of Health   Financial Resource Strain: Not on file  Food Insecurity: Not on file  Transportation Needs: Not on file  Physical Activity: Not on file  Stress: Not on file  Social Connections: Not on file  Intimate Partner Violence: Not on file   Family History  Problem Relation Age of Onset  . Healthy Mother   . Colon cancer Sister 7  . Hypertension Sister   . Hypertension Maternal Grandmother   . Breast cancer Maternal Aunt   . Hypertension Sister   . Esophageal cancer Neg Hx   . Inflammatory bowel disease Neg Hx   . Liver disease Neg Hx   . Pancreatic cancer Neg Hx   . Stomach cancer Neg Hx    I have reviewed her medical, social, and family history in detail and  updated the electronic medical record as necessary.    PHYSICAL EXAMINATION  BP (!) 144/76 (BP Location: Left Arm, Patient Position: Sitting, Cuff Size: Normal)   Pulse 88   Ht 5' 1"  (1.549 m) Comment: height measured without shoes  Wt 159 lb 4 oz (72.2 kg)   BMI 30.09 kg/m  Wt Readings from Last 3 Encounters:  04/17/20 159 lb 4 oz (72.2 kg)  06/28/19 178 lb (80.7 kg)  06/22/19 176 lb 3.2 oz (79.9 kg)   GEN: NAD, appears stated age, doesn't appear chronically ill PSYCH: Cooperative, without pressured speech EYE: Conjunctivae pink, sclerae anicteric ENT: MMM, without oral ulcers, no erythema or exudates noted NECK: Supple CV: RR without R/Gs  RESP: CTAB posteriorly, without wheezing GI: NABS, soft, NT/ND, without rebound or guarding, no HSM appreciated GU: DRE shows MSK/EXT: _ edema, no palmar erythema SKIN: No jaundice, no spider angiomata, no concerning rashes NEURO:  Alert & Oriented x 3, no focal deficits, no evidence of  asterixis   REVIEW OF DATA  I reviewed the following data at the time of this encounter:  GI Procedures and Studies  2021 colonoscopy 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  No evidence of dysplasia or malignancy is identified.  Laboratory Studies  Reviewed those in epic  Imaging Studies  No relevant studies to review   ASSESSMENT  Ms. Sobotta is a 54 y.o. female with a pmh significant for COPD/asthma, hypertension, hyperlipidemia, family history colon cancer (early in sister), colon polyp NOS.  The patient is seen today for evaluation and management of:  1. Abnormal colonoscopy   2. History of colon polyps   3. Family history of colon cancer    Patient is hemodynamically stable clinically it is not clear exactly what this polypoid mass is although it was ulcerated and showed evidence of  granulation tissue.  With the patient's family history this is concerning certainly for the possibility of an underlying malignancy but we are not completely having this defined as of yet.  Repeat colonoscopy is necessary.  In the setting of the potential for an underlying polyp it is reasonable for [place] to move forward with a potential advanced resection.  I will plan to do this in the hospital-based setting in case additional needs or techniques are required.  I will plan to obtain a CT abdomen pelvis to better define and ensure that we are not seeing large mass or lesion at this time as well as get preprocedure labs.  If an advanced resection is required then I will be prepared for this.  We discussed some of the techniques of advanced polypectomy which include Endoscopic Mucosal Resection, OVESCO Full-Thickness Resection, Endorotor Morcellation, and Tissue Ablation via Fulguration.  The risks and benefits of endoscopic evaluation were discussed with the patient; these include but are not limited to the risk of perforation, infection, bleeding, missed lesions, lack of diagnosis, severe illness requiring hospitalization, as well as anesthesia and sedation related illnesses.  During attempts at advanced resection, the risks of bleeding and perforation/leak are increased as opposed to diagnostic and screening procedures, and that was discussed with the patient as well.   In addition, I explained that with the possible need for piecemeal resection, subsequent short-interval endoscopic evaluation for follow up and potential retreatment of the lesion/area may be necessary.  I did offer, a referral to surgery in order for patient to have opportunity to discuss surgical management/intervention prior to finalizing decision for attempt at endoscopic removal, however, the patient deferred on this since we are not sure that this is malignant as of yet.  If, after attempt at removal of the polyp/lesion, it is found that the  patient has a complication or that an invasive lesion or malignant lesion is found, or that the polyp/lesion continues to recur, the patient is aware and understands that surgery may still be indicated/required.  All patient questions were answered, to the best of my ability, and the patient agrees to the aforementioned plan of action with follow-up as indicated.   PLAN  Procedures as outlined below CT abdomen pelvis with contrast to be obtained If any abnormalities we will then obtain a CEA level We will proceed with scheduling colonoscopy with EMR in the next 6 to 8 weeks Follow-up to be dictated based on results   Orders Placed This Encounter  Procedures  . Procedural/ Surgical Case Request: COLONOSCOPY  WITH PROPOFOL, ENDOSCOPIC MUCOSAL RESECTION  . CT Abdomen Pelvis W Contrast  . CBC  . Basic Metabolic Panel (BMET)  . INR/PT  . Ambulatory referral to Gastroenterology    New Prescriptions   NA SULFATE-K SULFATE-MG SULF (SUPREP BOWEL PREP KIT) 17.5-3.13-1.6 GM/177ML SOLN    Take 1 kit by mouth as directed. For colonoscopy prep   Modified Medications   No medications on file    Planned Follow Up No follow-ups on file.   Total Time in Face-to-Face and in Coordination of Care for patient including independent/personal interpretation/review of prior testing, medical history, examination, medication adjustment, communicating results with the patient directly, and documentation with the EHR is 45 minutes.   Justice Britain, MD [facility] Advanced [facility]oscopy Office # [phone]

## 2020-04-17 NOTE — Patient Instructions (Signed)
You have been scheduled for a CT scan of the abdomen and pelvis at [facility] ([address]---this is in the same building as [employer]).   You are scheduled on 05/14/20 at 2:30pm. You should arrive 15 minutes prior to your appointment time for registration. Please follow the written instructions below on the day of your exam:  WARNING: IF YOU ARE ALLERGIC TO IODINE/X-RAY DYE, PLEASE NOTIFY RADIOLOGY IMMEDIATELY AT [phone]! YOU WILL BE GIVEN A 13 HOUR PREMEDICATION PREP.  1) Do not eat or drink anything after 10:30am (4 hours prior to your test) 2) You have been given 2 bottles of oral contrast to drink. The solution may taste better if refrigerated, but do NOT add ice or any other liquid to this solution. Shake well before drinking.    Drink 1 bottle of contrast @ 12:30pm (2 hours prior to your exam)  Drink 1 bottle of contrast @ 1:30pm (1 hour prior to your exam)  You may take any medications as prescribed with a small amount of water, if necessary. If you take any of the following medications: METFORMIN, GLUCOPHAGE, GLUCOVANCE, AVANDAMET, RIOMET, FORTAMET, [place] MET, JANUMET, GLUMETZA or METAGLIP, you MAY be asked to HOLD this medication 48 hours AFTER the exam.  The purpose of you drinking the oral contrast is to aid in the visualization of your intestinal tract. The contrast solution may cause some diarrhea. Depending on your individual set of symptoms, you may also receive an intravenous injection of x-ray contrast/dye. Plan on being at [facility] for 30 minutes or longer, depending on the type of exam you are having performed.  This test typically takes 30-45 minutes to complete.  If you have any questions regarding your exam or if you need to reschedule, you may call the CT department at [phone] between the hours of 8:00 am and 5:00 pm, Monday-Friday.    Your provider has requested that you go to the basement level for lab work before  leaving today. Press "B" on the elevator. The lab is located at the first door on the left as you exit the elevator.  We have sent the following medications to your pharmacy for you to pick up at your convenience: Suprep   Thank you for choosing me and [facility].  Dr. Rush Landmark

## 2020-04-17 NOTE — Telephone Encounter (Signed)
Called and left message for pt, offering at 3:50pm appt today with Dr. Rush Landmark. Pt states that she was not aware of her appointment this morning.

## 2020-04-17 NOTE — Telephone Encounter (Signed)
Pt returned your call. She will be here for 3:50pm appt.

## 2020-04-18 ENCOUNTER — Other Ambulatory Visit (INDEPENDENT_AMBULATORY_CARE_PROVIDER_SITE_OTHER): Payer: BC Managed Care – PPO

## 2020-04-18 ENCOUNTER — Other Ambulatory Visit: Payer: Self-pay

## 2020-04-18 DIAGNOSIS — D649 Anemia, unspecified: Secondary | ICD-10-CM | POA: Diagnosis not present

## 2020-04-18 LAB — IBC + FERRITIN
Ferritin: 70.2 ng/mL (ref 10.0–291.0)
Iron: 133 ug/dL (ref 42–145)
Saturation Ratios: 32.6 % (ref 20.0–50.0)
Transferrin: 291 mg/dL (ref 212.0–360.0)

## 2020-04-20 ENCOUNTER — Encounter: Payer: Self-pay | Admitting: Gastroenterology

## 2020-04-20 DIAGNOSIS — Z800 Family history of malignant neoplasm of digestive organs: Secondary | ICD-10-CM | POA: Insufficient documentation

## 2020-04-20 DIAGNOSIS — R933 Abnormal findings on diagnostic imaging of other parts of digestive tract: Secondary | ICD-10-CM | POA: Insufficient documentation

## 2020-04-20 DIAGNOSIS — Z86010 Personal history of colonic polyps: Secondary | ICD-10-CM | POA: Insufficient documentation

## 2020-05-13 ENCOUNTER — Encounter: Payer: Self-pay | Admitting: Gastroenterology

## 2020-05-13 NOTE — Progress Notes (Signed)
Outside records to be scanned into the chart.  March 2021 colonoscopy done by Dr. Loreta Ave A large 15 to 20 mm polypoid nonobstructing large mass was found on the ileocecal valve.  There was some ulceration noted at the base of this mass.  It was difficult to biopsy as the forceps slipped off the surface of the mass when biopsies were attempted. A few small mouth diverticula were noted. Terminal ileum was normal. Hemorrhoids were noted on retroflexion.  Pathology consistent with ulcerated mucosa and submucosa with granulation tissue.  Multiple fragments of ulcerated mucosa with fibrinopurulent exudate and granulation tissue reaction, extending into distinct portions of submucosal adipose tissue.  No granuloma, epithelial dysplasia, or malignancy is identified.  These findings while nonspecific can represent injury associated to prominent lipomatous or prolapsing ileocecal valve.  Nevertheless if a neoplastic process is clinically suspected additional biopsies may be considered.  February 2021 clinic visit Colon cancer screening for evaluation in setting of sister who had early colon cancer in her 52s with patient having her last colonoscopy done at [facility] in 2013 which was reportedly normal.  These records will be scanned into the chart.   Corliss Parish, MD [facility] Advanced [facility]oscopy Office # [phone]

## 2020-05-14 ENCOUNTER — Other Ambulatory Visit: Payer: Self-pay

## 2020-05-14 ENCOUNTER — Ambulatory Visit (INDEPENDENT_AMBULATORY_CARE_PROVIDER_SITE_OTHER)
Admission: RE | Admit: 2020-05-14 | Discharge: 2020-05-14 | Disposition: A | Discharge status: Home / Self Care | Payer: BC Managed Care – PPO | Source: Ambulatory Visit | Attending: Gastroenterology | Admitting: Gastroenterology

## 2020-05-14 DIAGNOSIS — K7689 Other specified diseases of liver: Secondary | ICD-10-CM | POA: Diagnosis not present

## 2020-05-14 DIAGNOSIS — I700 Atherosclerosis of aorta: Secondary | ICD-10-CM | POA: Diagnosis not present

## 2020-05-14 DIAGNOSIS — Z86010 Personal history of colonic polyps: Secondary | ICD-10-CM

## 2020-05-14 DIAGNOSIS — R933 Abnormal findings on diagnostic imaging of other parts of digestive tract: Secondary | ICD-10-CM

## 2020-05-14 DIAGNOSIS — R1909 Other intra-abdominal and pelvic swelling, mass and lump: Secondary | ICD-10-CM | POA: Diagnosis not present

## 2020-05-14 DIAGNOSIS — K6389 Other specified diseases of intestine: Secondary | ICD-10-CM | POA: Diagnosis not present

## 2020-05-14 MED ORDER — IOHEXOL 300 MG/ML  SOLN
100.0000 mL | Freq: Once | INTRAMUSCULAR | Status: AC | PRN
  Administered 2020-05-14: 100 mL via INTRAVENOUS

## 2020-05-21 ENCOUNTER — Other Ambulatory Visit (HOSPITAL_COMMUNITY)
Admission: RE | Admit: 2020-05-21 | Discharge: 2020-05-21 | Disposition: A | Discharge status: Home / Self Care | Payer: BC Managed Care – PPO | Source: Ambulatory Visit | Attending: Gastroenterology | Admitting: Gastroenterology

## 2020-05-21 DIAGNOSIS — Z20822 Contact with and (suspected) exposure to covid-19: Secondary | ICD-10-CM | POA: Diagnosis not present

## 2020-05-21 DIAGNOSIS — U071 COVID-19: Secondary | ICD-10-CM | POA: Diagnosis not present

## 2020-05-22 ENCOUNTER — Telehealth: Payer: Self-pay | Admitting: Gastroenterology

## 2020-05-22 LAB — SARS CORONAVIRUS 2 (TAT 6-24 HRS): SARS Coronavirus 2: POSITIVE — AB

## 2020-05-22 NOTE — Telephone Encounter (Signed)
Mansouraty, Telford Nab., MD  Timothy Lasso, RN Cc: Kerin Perna, NP; Juanita Craver, MD Sherrika Weakland,  Please let patient know that she has tested positive for Covid.   Her procedure will need to be rescheduled from this Thursday.  I am placing the patient's PCP on here as well and she should reach out to them depending on her symptoms to see if any treatments may be necessary or needed.  Please let us know what date she is rescheduled for in 3-4 weeks.  Thank you.  GM  Appt has been rescheduled to 06/28/20 at 915 am at [facility] with Dr Rush Landmark.  No COVID test will be needed due to recent positive.  The pt has been advised and all information mailed to the pt home and sent to My Chart

## 2020-05-22 NOTE — Telephone Encounter (Signed)
They just called from the [place] drive up testing site to advise the patient tested positive.

## 2020-05-22 NOTE — Progress Notes (Signed)
Jessica at Dr. Donneta Romberg office aware of covid test results.

## 2020-06-12 ENCOUNTER — Telehealth (INDEPENDENT_AMBULATORY_CARE_PROVIDER_SITE_OTHER): Payer: BC Managed Care – PPO | Admitting: Primary Care

## 2020-06-12 ENCOUNTER — Other Ambulatory Visit (HOSPITAL_COMMUNITY)
Admission: RE | Admit: 2020-06-12 | Discharge: 2020-06-12 | Disposition: A | Discharge status: Home / Self Care | Payer: BC Managed Care – PPO | Source: Ambulatory Visit | Attending: Primary Care | Admitting: Primary Care

## 2020-06-12 DIAGNOSIS — N898 Other specified noninflammatory disorders of vagina: Secondary | ICD-10-CM | POA: Insufficient documentation

## 2020-06-27 ENCOUNTER — Encounter (HOSPITAL_COMMUNITY): Payer: Self-pay | Admitting: Gastroenterology

## 2020-06-27 NOTE — Progress Notes (Signed)
PCP - Juluis Mire, NP Cardiologist - n/a  Chest x-ray - n/a EKG - DOS 06/28/20 Stress Test - n/a ECHO - n/a Cardiac Cath - n/a  STOP now taking any Aspirin (unless otherwise instructed by your surgeon), Aleve, Naproxen, Ibuprofen, Motrin, Advil, Goody's, BC's, all herbal medications, fish oil, and all vitamins.   Coronavirus Screening Patient had positive covid test on 05/21/20.  No need to retest for covid, within 90 day window. Do you have any of the following symptoms:  Cough yes/no: No Fever (>100.34F)  yes/no: No Runny nose yes/no: No Sore throat yes/no: No Difficulty breathing/shortness of breath  yes/no: No  Have you traveled in the last 14 days and where? yes/no: No  Patient verbalized understanding of instructions that were given via phone.

## 2020-06-27 NOTE — Anesthesia Preprocedure Evaluation (Addendum)
Anesthesia Evaluation  Patient identified by MRN, date of birth, ID band Patient awake    Reviewed: Allergy & Precautions, NPO status , Patient's Chart, lab work & pertinent test results  History of Anesthesia Complications Negative for: history of anesthetic complications  Airway Mallampati: II  TM Distance: >3 FB Neck ROM: Full    Dental no notable dental hx.    Pulmonary asthma , COPD,  COPD inhaler, Current Smoker,    Pulmonary exam normal        Cardiovascular hypertension, Pt. on medications Normal cardiovascular exam     Neuro/Psych negative neurological ROS  negative psych ROS   GI/Hepatic negative GI ROS, (+)     substance abuse ("21 drinks per week")  alcohol use,   Endo/Other  negative endocrine ROS  Renal/GU negative Renal ROS  negative genitourinary   Musculoskeletal negative musculoskeletal ROS (+)   Abdominal   Peds  Hematology negative hematology ROS (+)   Anesthesia Other Findings Day of surgery medications reviewed with patient.  Reproductive/Obstetrics negative OB ROS                            Anesthesia Physical Anesthesia Plan  ASA: III  Anesthesia Plan: MAC   Post-op Pain Management:    Induction:   PONV Risk Score and Plan: 1 and Treatment may vary due to age or medical condition and Propofol infusion  Airway Management Planned: Natural Airway and Nasal Cannula  Additional Equipment: None  Intra-op Plan:   Post-operative Plan:   Informed Consent: I have reviewed the patients History and Physical, chart, labs and discussed the procedure including the risks, benefits and alternatives for the proposed anesthesia with the patient or authorized representative who has indicated his/her understanding and acceptance.       Plan Discussed with: CRNA  Anesthesia Plan Comments:        Anesthesia Quick Evaluation

## 2020-06-28 ENCOUNTER — Encounter (HOSPITAL_COMMUNITY): Payer: Self-pay | Admitting: Gastroenterology

## 2020-06-28 ENCOUNTER — Ambulatory Visit (HOSPITAL_COMMUNITY): Payer: BC Managed Care – PPO | Admitting: Anesthesiology

## 2020-06-28 ENCOUNTER — Ambulatory Visit (HOSPITAL_COMMUNITY)
Admission: RE | Admit: 2020-06-28 | Discharge: 2020-06-28 | Disposition: A | Discharge status: Home / Self Care | Payer: BC Managed Care – PPO | Attending: Gastroenterology | Admitting: Gastroenterology

## 2020-06-28 ENCOUNTER — Encounter (HOSPITAL_COMMUNITY)
Admission: RE | Disposition: A | Discharge status: Home / Self Care | Payer: Self-pay | Source: Home / Self Care | Attending: Gastroenterology

## 2020-06-28 ENCOUNTER — Other Ambulatory Visit: Payer: Self-pay

## 2020-06-28 DIAGNOSIS — K635 Polyp of colon: Secondary | ICD-10-CM | POA: Diagnosis not present

## 2020-06-28 DIAGNOSIS — F17210 Nicotine dependence, cigarettes, uncomplicated: Secondary | ICD-10-CM | POA: Insufficient documentation

## 2020-06-28 DIAGNOSIS — K5140 Inflammatory polyps of colon without complications: Secondary | ICD-10-CM | POA: Insufficient documentation

## 2020-06-28 DIAGNOSIS — Z860100 Personal history of colon polyps, unspecified: Secondary | ICD-10-CM

## 2020-06-28 DIAGNOSIS — I10 Essential (primary) hypertension: Secondary | ICD-10-CM | POA: Diagnosis not present

## 2020-06-28 DIAGNOSIS — E785 Hyperlipidemia, unspecified: Secondary | ICD-10-CM | POA: Diagnosis not present

## 2020-06-28 DIAGNOSIS — R933 Abnormal findings on diagnostic imaging of other parts of digestive tract: Secondary | ICD-10-CM

## 2020-06-28 HISTORY — PX: ENDOSCOPIC MUCOSAL RESECTION: SHX6839

## 2020-06-28 HISTORY — DX: Other seasonal allergic rhinitis: J30.2

## 2020-06-28 HISTORY — PX: COLONOSCOPY WITH PROPOFOL: SHX5780

## 2020-06-28 HISTORY — PX: SUBMUCOSAL LIFTING INJECTION: SHX6855

## 2020-06-28 SURGERY — COLONOSCOPY WITH PROPOFOL
Anesthesia: Monitor Anesthesia Care

## 2020-06-28 MED ORDER — LACTATED RINGERS IV SOLN: INTRAVENOUS | Status: DC

## 2020-06-28 MED ORDER — FLEET ENEMA 7-19 GM/118ML RE ENEM
ENEMA | RECTAL | Status: AC
  Filled 2020-06-28: qty 1

## 2020-06-28 MED ORDER — LIDOCAINE 2% (20 MG/ML) 5 ML SYRINGE
INTRAMUSCULAR | Status: DC | PRN
  Administered 2020-06-28: 40 mg via INTRAVENOUS

## 2020-06-28 MED ORDER — FLEET ENEMA 7-19 GM/118ML RE ENEM
1.0000 | ENEMA | Freq: Once | RECTAL | Status: AC
  Administered 2020-06-28: 1 via RECTAL

## 2020-06-28 MED ORDER — SODIUM CHLORIDE 0.9 % IV SOLN: INTRAVENOUS | Status: DC

## 2020-06-28 MED ORDER — PROPOFOL 10 MG/ML IV BOLUS
INTRAVENOUS | Status: DC | PRN
  Administered 2020-06-28 (×3): 15 mg via INTRAVENOUS

## 2020-06-28 MED ORDER — PROPOFOL 500 MG/50ML IV EMUL
INTRAVENOUS | Status: DC | PRN
  Administered 2020-06-28: 100 ug/kg/min via INTRAVENOUS

## 2020-06-28 SURGICAL SUPPLY — 21 items

## 2020-06-28 NOTE — OR Nursing (Signed)
Enema X1 given with yellow cloudy results. MD notified

## 2020-06-28 NOTE — Anesthesia Procedure Notes (Signed)
Procedure Name: MAC Date/Time: 06/28/2020 7:48 AM Performed by: Rande Brunt, CRNA Pre-anesthesia Checklist: Patient identified, Emergency Drugs available, Suction available, Patient being monitored and Timeout performed Patient Re-evaluated:Patient Re-evaluated prior to induction Oxygen Delivery Method: Non-rebreather mask Preoxygenation: Pre-oxygenation with 100% oxygen Induction Type: IV induction Dental Injury: Teeth and Oropharynx as per pre-operative assessment

## 2020-06-28 NOTE — Transfer of Care (Signed)
Immediate Anesthesia Transfer of Care Note  Patient: Carrie Potts  Procedure(s) Performed: COLONOSCOPY WITH PROPOFOL (N/A ) ENDOSCOPIC MUCOSAL RESECTION (N/A ) POLYPECTOMY SUBMUCOSAL LIFTING INJECTION  Patient Location: PACU  Anesthesia Type:MAC  Level of Consciousness: awake, alert  and oriented  Airway & Oxygen Therapy: Patient Spontanous Breathing and Patient connected to face mask oxygen  Post-op Assessment: Report given to RN, Post -op Vital signs reviewed and stable and Patient moving all extremities  Post vital signs: Reviewed and stable  Last Vitals:  Vitals Value Taken Time  BP 154/88 06/28/20 0842  Temp    Pulse 57 06/28/20 0842  Resp 16 06/28/20 0842  SpO2 100 % 06/28/20 0842    Last Pain:  Vitals:   06/28/20 0647  TempSrc: Oral  PainSc: 0-No pain         Complications: No complications documented.

## 2020-06-28 NOTE — Anesthesia Postprocedure Evaluation (Signed)
Anesthesia Post Note  Patient: Carrie Potts  Procedure(s) Performed: COLONOSCOPY WITH PROPOFOL (N/A ) ENDOSCOPIC MUCOSAL RESECTION (N/A ) POLYPECTOMY SUBMUCOSAL LIFTING INJECTION     Patient location during evaluation: PACU Anesthesia Type: MAC Level of consciousness: awake and alert and oriented Pain management: pain level controlled Vital Signs Assessment: post-procedure vital signs reviewed and stable Respiratory status: spontaneous breathing, nonlabored ventilation and respiratory function stable Cardiovascular status: blood pressure returned to baseline Postop Assessment: no apparent nausea or vomiting Anesthetic complications: no   No complications documented.  Last Vitals:  Vitals:   06/28/20 0842 06/28/20 0900  BP: (!) 154/88 (!) 167/97  Pulse: (!) 57 (!) 54  Resp: 16 15  Temp: (!) 36.1 C   SpO2: 100% 99%    Last Pain:  Vitals:   06/28/20 0842  TempSrc:   PainSc: 0-No pain                 Brennan Bailey

## 2020-06-28 NOTE — H&P (Signed)
GASTROENTEROLOGY PROCEDURE H&P NOTE   Primary Care Physician: Kerin Perna, NP  HPI: Carrie Potts is a 55 y.o. female who presents for Colonoscopy for evaluation of ileocecal polypoid lesion/mass for potential resection.  Negative CTAP other than a lipomatous appearing ICV.  Past Medical History:  Diagnosis Date  . Asthma   . COPD (chronic obstructive pulmonary disease) ([place])   . HLD (hyperlipidemia)   . Hypertension   . Seasonal allergies    Past Surgical History:  Procedure Laterality Date  . COLONOSCOPY  2021   Dr Collene Mares  . NO PAST SURGERIES     Current Facility-Administered Medications  Medication Dose Route Frequency Provider Last Rate Last Admin  . lactated ringers infusion   Intravenous Continuous Mansouraty, Telford Nab., MD 10 mL/hr at 06/28/20 0722 New Bag at 06/28/20 0722   Facility-Administered Medications Ordered in Other Encounters  Medication Dose Route Frequency Provider Last Rate Last Admin  . lidocaine 2% (20 mg/mL) 5 mL syringe   Intravenous Anesthesia Intra-op Rande Brunt, CRNA   40 mg at 06/28/20 0740   No Known Allergies Family History  Problem Relation Age of Onset  . Healthy Mother   . Colon cancer Sister 43  . Hypertension Sister   . Hypertension Maternal Grandmother   . Breast cancer Maternal Aunt   . Hypertension Sister   . Esophageal cancer Neg Hx   . Inflammatory bowel disease Neg Hx   . Liver disease Neg Hx   . Pancreatic cancer Neg Hx   . Stomach cancer Neg Hx    Social History   Socioeconomic History  . Marital status: Single    Spouse name: Not on file  . Number of children: 1  . Years of education: Not on file  . Highest education level: Not on file  Occupational History  . Occupation: CNA  Tobacco Use  . Smoking status: Current Every Day Smoker    Packs/day: 1.00    Years: 37.00    Pack years: 37.00    Types: Cigarettes  . Smokeless tobacco: Never Used  Vaping Use  . Vaping Use: Never used   Substance and Sexual Activity  . Alcohol use: Yes    Alcohol/week: 21.0 standard drinks    Types: 21 Cans of beer per week    Comment: 3 beers per day  . Drug use: Never  . Sexual activity: Not Currently    Birth control/protection: Post-menopausal  Other Topics Concern  . Not on file  Social History Narrative  . Not on file   Social Determinants of Health   Financial Resource Strain: Not on file  Food Insecurity: Not on file  Transportation Needs: Not on file  Physical Activity: Not on file  Stress: Not on file  Social Connections: Not on file  Intimate Partner Violence: Not on file    Physical Exam: Vital signs in last 24 hours: Temp:  [98.3 F (36.8 C)] 98.3 F (36.8 C) (02/17 0647) Pulse Rate:  [77] 77 (02/17 0647) Resp:  [18] 18 (02/17 0647) BP: (147)/(79) 147/79 (02/17 0647) SpO2:  [100 %] 100 % (02/17 0647) Weight:  [72.1 kg] 72.1 kg (02/17 0647)   GEN: NAD EYE: Sclerae anicteric ENT: MMM CV: Non-tachycardic GI: Soft, NT/ND NEURO:  Alert & Oriented x 3  Lab Results: No results for input(s): WBC, HGB, HCT, PLT in the last 72 hours. BMET No results for input(s): NA, K, CL, CO2, GLUCOSE, BUN, CREATININE, CALCIUM in the last 72 hours. LFT  No results for input(s): PROT, ALBUMIN, AST, ALT, ALKPHOS, BILITOT, BILIDIR, IBILI in the last 72 hours. PT/INR No results for input(s): LABPROT, INR in the last 72 hours.   Impression / Plan: This is a 55 y.o.female who presents for Colonoscopy for evaluation of ileocecal polypoid lesion/mass for potential resection.  Negative CTAP other than a lipomatous appearing ICV.  The risks and benefits of endoscopic evaluation were discussed with the patient; these include but are not limited to the risk of perforation, infection, bleeding, missed lesions, lack of diagnosis, severe illness requiring hospitalization, as well as anesthesia and sedation related illnesses.  The patient is agreeable to proceed.    Justice Britain, MD [facility] Office # [phone]

## 2020-06-28 NOTE — Op Note (Addendum)
[facility] Patient Name: Carrie Potts Procedure Date : 06/28/2020 MRN: [id number] Attending MD: Justice Britain , MD Date of Birth: 1966-03-07 CSN: [id number] Age: 55 Admit Type: Outpatient Procedure:                Colonoscopy Indications:              Excision of colonic polyp Providers:                Justice Britain, MD, Kary Kos RN, RN,                            Laverda Sorenson, Technician, Hedy Camara Referring MD:             Juanita Craver, MD, Kerin Perna Medicines:                Monitored Anesthesia Care Complications:            No immediate complications. Estimated Blood Loss:     Estimated blood loss was minimal. Procedure:                Pre-Anesthesia Assessment:                           - Prior to the procedure, a History and Physical                            was performed, and patient medications and                            allergies were reviewed. The patient's tolerance of                            previous anesthesia was also reviewed. The risks                            and benefits of the procedure and the sedation                            options and risks were discussed with the patient.                            All questions were answered, and informed consent                            was obtained. Prior Anticoagulants: The patient has                            taken no previous anticoagulant or antiplatelet                            agents except for NSAID medication. ASA Grade                            Assessment: II - A patient with mild systemic  disease. After reviewing the risks and benefits,                            the patient was deemed in satisfactory condition to                            undergo the procedure.                           After obtaining informed consent, the colonoscope                            was passed under direct vision. Throughout the                             procedure, the patient's blood pressure, pulse, and                            oxygen saturations were monitored continuously. The                            CF-HQ190L ([id number]) Olympus colonoscope was                            introduced through the anus and advanced to the 5                            cm into the ileum. The colonoscopy was performed                            without difficulty. The patient tolerated the                            procedure. The quality of the bowel preparation was                            adequate. The terminal ileum, ileocecal valve,                            appendiceal orifice, and rectum were photographed. Scope In: 7:51:05 AM Scope Out: 8:32:41 AM Scope Withdrawal Time: 0 hours 39 minutes 3 seconds  Total Procedure Duration: 0 hours 41 minutes 36 seconds  Findings:      The digital rectal exam findings include hemorrhoids. Pertinent       negatives include no palpable rectal lesions.      A moderate amount of liquid semi-liquid stool was found in the entire       colon, interfering with visualization. Lavage of the area was performed       using copious amounts, resulting in clearance with adequate       visualization.      The visualized portion of the terminal ileum appeared normal.      Localized area of mucosa on the ileocecal valve (colonic side) was       granular-appearing approximately 20 mm in size. Preparations were made  for mucosal resection. NBI Imaging and White-light endoscopy was done to       demarcate the borders of the area. Orise gel was injected to raise the       lesion. Piecemeal cold mucosal resection using a snare was performed.       Resection and retrieval were complete.      A single small-mouthed diverticulum was found in the cecum.      A few small-mouthed diverticula were found in the recto-sigmoid colon       and sigmoid colon.      Normal mucosa was found in the entire colon otherwise.       Non-bleeding non-thrombosed internal hemorrhoids were found during       retroflexion, during perianal exam and during digital exam. The       hemorrhoids were Grade II (internal hemorrhoids that prolapse but reduce       spontaneously).      Extensive viewing of the ascending colon as well as the cecum and ICV       were performed to ensure that we minimized risk of missing another       lesion that had previously been described as large and polypoid - but       none were seen. Impression:               - Hemorrhoids found on digital rectal exam.                           - Stool in the entire examined colon - lavaged with                            adequate visualization.                           - The examined portion of the terminal ileum was                            normal.                           - Mild granularity at the ileocecal valve                            approximately 20 mm in size.                           - Single diverticulum in the cecum.                           - Diverticulosis in the recto-sigmoid colon and in                            the sigmoid colon.                           - Normal mucosa in the entire examined colon                            otherwise.                           -  Non-bleeding non-thrombosed internal hemorrhoids.                           - Extensive viewing of the ascending colon as well                            as the cecum and ICV were performed to ensure that                            we minimized risk of missing another lesion that                            had previously been described as large and polypoid                            - but none were seen. Recommendation:           - The patient will be observed post-procedure,                            until all discharge criteria are met.                           - Discharge patient to home.                           - Low residue diet for 1 week.                            - Await pathology results.                           - Consider repeat colonoscopy in 1 year for                            surveillance and if nothing is found at that time                            then return to 5-year colonoscopy screening due to                            family history of colon cancer.                           - The findings and recommendations were discussed                            with the patient.                           - The findings and recommendations were discussed                            with the patient's family. Procedure Code(s):        --- Professional ---  62263, Colonoscopy, flexible; with endoscopic                            mucosal resection Diagnosis Code(s):        --- Professional ---                           K64.9, Unspecified hemorrhoids                           K63.89, Other specified diseases of intestine                           K63.5, Polyp of colon                           K57.30, Diverticulosis of large intestine without                            perforation or abscess without bleeding CPT copyright 2019 American Medical Association. All rights reserved. The codes documented in this report are preliminary and upon coder review may  be revised to meet current compliance requirements. Justice Britain, MD 06/28/2020 8:56:51 AM Number of Addenda: 0

## 2020-07-01 ENCOUNTER — Encounter (HOSPITAL_COMMUNITY): Payer: Self-pay | Admitting: Gastroenterology

## 2020-07-02 ENCOUNTER — Other Ambulatory Visit: Payer: Self-pay

## 2020-07-02 ENCOUNTER — Encounter (INDEPENDENT_AMBULATORY_CARE_PROVIDER_SITE_OTHER): Payer: Self-pay | Admitting: Primary Care

## 2020-07-02 ENCOUNTER — Ambulatory Visit (INDEPENDENT_AMBULATORY_CARE_PROVIDER_SITE_OTHER): Payer: BC Managed Care – PPO | Admitting: Primary Care

## 2020-07-02 ENCOUNTER — Encounter: Payer: Self-pay | Admitting: Gastroenterology

## 2020-07-02 VITALS — BP 147/91 | HR 78 | Temp 97.3°F | Ht 61.0 in | Wt 158.4 lb

## 2020-07-02 DIAGNOSIS — N898 Other specified noninflammatory disorders of vagina: Secondary | ICD-10-CM | POA: Diagnosis not present

## 2020-07-02 DIAGNOSIS — I10 Essential (primary) hypertension: Secondary | ICD-10-CM | POA: Diagnosis not present

## 2020-07-02 DIAGNOSIS — D1722 Benign lipomatous neoplasm of skin and subcutaneous tissue of left arm: Secondary | ICD-10-CM | POA: Diagnosis not present

## 2020-07-02 LAB — SURGICAL PATHOLOGY

## 2020-07-02 MED ORDER — AMLODIPINE BESYLATE 10 MG PO TABS: 10.0000 mg | ORAL_TABLET | Freq: Every day | ORAL | 1 refills | Status: DC

## 2020-07-02 MED ORDER — LISINOPRIL-HYDROCHLOROTHIAZIDE 20-25 MG PO TABS: 1.0000 | ORAL_TABLET | Freq: Every day | ORAL | 3 refills | Status: DC

## 2020-07-02 MED ORDER — NAPROXEN 500 MG PO TABS: 500.0000 mg | ORAL_TABLET | Freq: Two times a day (BID) | ORAL | 1 refills | Status: DC

## 2020-07-02 NOTE — Progress Notes (Signed)
Established Patient Office Visit  Subjective:  Patient ID: Carrie Potts, female    DOB: 07-09-1965  Age: 55 y.o. MRN: [id number]  CC:  Chief Complaint  Patient presents with  . Vaginal Discharge  . mass on left arm    HPI Carrie Potts is a 55 year old female  presents for vaginal discharge and left upper arm lymphoma- painful with raising arm, trying to lift anything. 7/10 pain level.    Past Medical History:  Diagnosis Date  . Asthma   . COPD (chronic obstructive pulmonary disease) ([place])   . HLD (hyperlipidemia)   . Hypertension   . Seasonal allergies     Past Surgical History:  Procedure Laterality Date  . COLONOSCOPY  2021   Dr Collene Mares  . COLONOSCOPY WITH PROPOFOL N/A 06/28/2020   Procedure: COLONOSCOPY WITH PROPOFOL;  Surgeon: Rush Landmark Telford Nab., MD;  Location: [place];  Service: Gastroenterology;  Laterality: N/A;  . ENDOSCOPIC MUCOSAL RESECTION N/A 06/28/2020   Procedure: ENDOSCOPIC MUCOSAL RESECTION;  Surgeon: Rush Landmark Telford Nab., MD;  Location: [place];  Service: Gastroenterology;  Laterality: N/A;  . NO PAST SURGERIES    . SUBMUCOSAL LIFTING INJECTION  06/28/2020   Procedure: SUBMUCOSAL LIFTING INJECTION;  Surgeon: Rush Landmark Telford Nab., MD;  Location: [facility] ENDOSCOPY;  Service: Gastroenterology;;    Family History  Problem Relation Age of Onset  . Healthy Mother   . Colon cancer Sister 60  . Hypertension Sister   . Hypertension Maternal Grandmother   . Breast cancer Maternal Aunt   . Hypertension Sister   . Esophageal cancer Neg Hx   . Inflammatory bowel disease Neg Hx   . Liver disease Neg Hx   . Pancreatic cancer Neg Hx   . Stomach cancer Neg Hx     Social History   Socioeconomic History  . Marital status: Single    Spouse name: Not on file  . Number of children: 1  . Years of education: Not on file  . Highest education level: Not on file  Occupational History  . Occupation: CNA  Tobacco Use  . Smoking  status: Current Every Day Smoker    Packs/day: 1.00    Years: 37.00    Pack years: 37.00    Types: Cigarettes  . Smokeless tobacco: Never Used  Vaping Use  . Vaping Use: Never used  Substance and Sexual Activity  . Alcohol use: Yes    Alcohol/week: 21.0 standard drinks    Types: 21 Cans of beer per week    Comment: 3 beers per day  . Drug use: Never  . Sexual activity: Not Currently    Birth control/protection: Post-menopausal  Other Topics Concern  . Not on file  Social History Narrative  . Not on file   Social Determinants of Health   Financial Resource Strain: Not on file  Food Insecurity: Not on file  Transportation Needs: Not on file  Physical Activity: Not on file  Stress: Not on file  Social Connections: Not on file  Intimate Partner Violence: Not on file    Outpatient Medications Prior to Visit  Medication Sig Dispense Refill  . albuterol (VENTOLIN HFA) 108 (90 Base) MCG/ACT inhaler Inhale 2 puffs into the lungs every 6 (six) hours as needed for wheezing or shortness of breath. 6.7 g 1  . gabapentin (NEURONTIN) 600 MG tablet Take 600 mg by mouth 3 (three) times daily as needed (back pain.).    Marland Kitchen loratadine (CLARITIN) 10 MG tablet Take 1  tablet (10 mg total) by mouth at bedtime. (Patient taking differently: Take 10 mg by mouth in the morning and at bedtime.) 90 tablet 3  . Na Sulfate-K Sulfate-Mg Sulf (SUPREP BOWEL PREP KIT) 17.5-3.13-1.6 GM/177ML SOLN Take 1 kit by mouth as directed. For colonoscopy prep 354 mL 0  . Respiratory Therapy Supplies (NEBULIZER/TUBING/MOUTHPIECE) KIT 1 kit by Does not apply route every 4 (four) hours as needed. 1 kit 1  . simvastatin (ZOCOR) 20 MG tablet Take 1 tablet (20 mg total) by mouth daily. 90 tablet 1  . amLODipine (NORVASC) 10 MG tablet Take 1 tablet (10 mg total) by mouth daily. (Patient taking differently: Take 10 mg by mouth every evening.) 90 tablet 1  . lisinopril-hydrochlorothiazide (ZESTORETIC) 20-25 MG tablet Take 1 tablet  by mouth daily. 90 tablet 3   No facility-administered medications prior to visit.    No Known Allergies  ROS Review of Systems  Genitourinary: Positive for vaginal discharge.  Skin:       Lipoma  left upper arm    All other systems reviewed and are negative.     Objective:    Physical Exam  BP (!) 147/91 (BP Location: Right Arm, Patient Position: Sitting, Cuff Size: Normal)   Pulse 78   Temp (!) 97.3 F (36.3 C) (Temporal)   Ht _0  (1.549 m)   Wt 158 lb 6.4 oz (71.8 kg)   LMP  (LMP Unknown)   SpO2 94%   BMI 29.93 kg/m  Wt Readings from Last 3 Encounters:  07/02/20 158 lb 6.4 oz (71.8 kg)  06/28/20 158 lb 15.2 oz (72.1 kg)  04/17/20 159 lb 4 oz (72.2 kg)   Vitals:   07/02/20 1610 07/02/20 1629  BP: (!) 156/92 (!) 147/91  Pulse: 77 78  Temp: (!) 97.3 F (36.3 C)   TempSrc: Temporal   SpO2: 94%   Weight: 158 lb 6.4 oz (71.8 kg)   Height: _1  (1.549 m)    General: Vital signs reviewed.  Patient is well-developed and well-nourished,woman  in no acute distress and cooperative with exam.  Head: Normocephalic and atraumatic. Eyes: EOMI, conjunctivae normal, no scleral icterus. Left ear cerumen impaction  Neck: Supple, trachea midline, normal ROM, no JVD, masses, thyromegaly, or carotid bruit present.  Cardiovascular: RRR, S1 normal, S2 normal, no murmurs, gallops, or rubs. Pulmonary/Chest: Clear to auscultation bilaterally, no wheezes, rales, or rhonchi. Abdominal: Soft, non-tender, non-distended, BS +, no masses, organomegaly, or guarding present.  Musculoskeletal: No joint deformities, erythema, or stiffness, lipoma upper arm left tender  Extremities: No lower extremity edema bilaterally,  pulses symmetric and intact bilaterally. No cyanosis or clubbing. Neurological: A&O x3, Strength is normal and symmetric bilaterally, Skin: Warm, dry and intact. No rashes or erythema. Psychiatric: Normal mood and affect. speech and behavior is normal. Cognition and memory  are normal.    Health Maintenance Due  Topic Date Due  . Hepatitis C Screening  Never done  . COVID-19 Vaccine (1) Never done  . INFLUENZA VACCINE  12/11/2019    There are no preventive care reminders to display for this patient.  No results found for: TSH Lab Results  Component Value Date   WBC 7.0 04/17/2020   HGB 13.8 04/17/2020   HCT 42.7 04/17/2020   MCV 77.0 (L) 04/17/2020   PLT 276.0 04/17/2020   Lab Results  Component Value Date   NA 139 04/17/2020   K 4.3 04/17/2020   CO2 29 04/17/2020   GLUCOSE 95 04/17/2020  BUN 15 04/17/2020   CREATININE 0.87 04/17/2020   BILITOT <0.2 05/18/2019   ALKPHOS 69 05/18/2019   AST 23 05/18/2019   ALT 24 05/18/2019   PROT 6.2 05/18/2019   ALBUMIN 4.2 05/18/2019   CALCIUM 9.2 04/17/2020   ANIONGAP 11 02/22/2019   GFR 76.45 04/17/2020   Lab Results  Component Value Date   CHOL 187 05/18/2019   Lab Results  Component Value Date   HDL 54 05/18/2019   Lab Results  Component Value Date   LDLCALC 97 05/18/2019   Lab Results  Component Value Date   TRIG 210 (H) 05/18/2019   Lab Results  Component Value Date   CHOLHDL 3.5 05/18/2019   No results found for: HGBA1C    Assessment & Plan:   Carrie Potts was seen today for vaginal discharge and mass on left arm.  Diagnoses and all orders for this visit:  Vaginal odor -     Cervicovaginal ancillary only  Vaginal discharge -     Cervicovaginal ancillary only  Essential hypertension Counseled on blood pressure goal of less than 130/80, low-sodium, DASH diet, medication compliance, 150 minutes of moderate intensity exercise per week. Discussed medication compliance, adverse effects. Prescribed amlodipine 10 mg and HCTZ 25 mg daily  Lipoma of left upper extremity -     Ambulatory referral to General Surgery  Other orders -     lisinopril-hydrochlorothiazide (ZESTORETIC) 20-25 MG tablet; Take 1 tablet by mouth daily. -     amLODipine (NORVASC) 10 MG tablet; Take 1  tablet (10 mg total) by mouth daily. -     naproxen (NAPROSYN) 500 MG tablet; Take 1 tablet (500 mg total) by mouth 2 (two) times daily with a meal.   Follow-up: Return in about 6 weeks (around 08/13/2020) for Bp f/u on medication in person.    Kerin Perna, NP

## 2020-07-02 NOTE — Patient Instructions (Signed)
Lipoma  A lipoma is a noncancerous (benign) tumor that is made up of fat cells. This is a very common type of soft-tissue growth. Lipomas are usually found under the skin (subcutaneous). They may occur in any tissue of the body that contains fat. Common areas for lipomas to appear include the back, arms, shoulders, buttocks, and thighs. Lipomas grow slowly, and they are usually painless. Most lipomas do not cause problems and do not require treatment. What are the causes? The cause of this condition is not known. What increases the risk? You are more likely to develop this condition if:  You are 40-60 years old.  You have a family history of lipomas. What are the signs or symptoms? A lipoma usually appears as a small, round bump under the skin. In most cases, the lump will:  Feel soft or rubbery.  Not cause pain or other symptoms. However, if a lipoma is located in an area where it pushes on nerves, it can become painful or cause other symptoms. How is this diagnosed? A lipoma can usually be diagnosed with a physical exam. You may also have tests to confirm the diagnosis and to rule out other conditions. Tests may include:  Imaging tests, such as a CT scan or an MRI.  Removal of a tissue sample to be looked at under a microscope (biopsy). How is this treated? Treatment for this condition depends on the size of the lipoma and whether it is causing any symptoms.  For small lipomas that are not causing problems, no treatment is needed.  If a lipoma is bigger or it causes problems, surgery may be done to remove the lipoma. Lipomas can also be removed to improve appearance. Most often, the procedure is done after applying a medicine that numbs the area (local anesthetic).  Liposuction may be done to reduce the size of the lipoma before it is removed through surgery, or it may be done to remove the lipoma. Lipomas are removed with this method in order to limit incision size and scarring. A  liposuction tube is inserted through a small incision into the lipoma, and the contents of the lipoma are removed through the tube with suction. Follow these instructions at home:  Watch your lipoma for any changes.  Keep all follow-up visits as told by your health care provider. This is important. Contact a health care provider if:  Your lipoma becomes larger or hard.  Your lipoma becomes painful, red, or increasingly swollen. These could be signs of infection or a more serious condition. Get help right away if:  You develop tingling or numbness in an area near the lipoma. This could indicate that the lipoma is causing nerve damage. Summary  A lipoma is a noncancerous tumor that is made up of fat cells.  Most lipomas do not cause problems and do not require treatment.  If a lipoma is bigger or it causes problems, surgery may be done to remove the lipoma.  Contact a health care provider if your lipoma becomes larger or hard, or if it becomes painful, red, or increasingly swollen. Pain, redness, and swelling could be signs of infection or a more serious condition. This information is not intended to replace advice given to you by your health care provider. Make sure you discuss any questions you have with your health care provider. Document Revised: 12/13/2018 Document Reviewed: 12/13/2018 Elsevier Patient Education  2021 Elsevier Inc.  

## 2020-07-03 LAB — CERVICOVAGINAL ANCILLARY ONLY
Bacterial Vaginitis (gardnerella): POSITIVE — AB
Candida Glabrata: NEGATIVE
Candida Vaginitis: POSITIVE — AB
Chlamydia: NEGATIVE
Comment: NEGATIVE
Comment: NEGATIVE
Comment: NEGATIVE
Comment: NEGATIVE
Comment: NEGATIVE
Comment: NORMAL
Neisseria Gonorrhea: NEGATIVE
Trichomonas: POSITIVE — AB

## 2020-07-04 ENCOUNTER — Other Ambulatory Visit (INDEPENDENT_AMBULATORY_CARE_PROVIDER_SITE_OTHER): Payer: Self-pay | Admitting: Primary Care

## 2020-07-04 MED ORDER — METRONIDAZOLE 500 MG PO TABS
500.0000 mg | ORAL_TABLET | Freq: Two times a day (BID) | ORAL | 0 refills | Status: DC
Start: 2020-07-04 — End: 2020-09-24

## 2020-07-04 MED ORDER — FLUCONAZOLE 150 MG PO TABS: 150.0000 mg | ORAL_TABLET | Freq: Once | ORAL | 0 refills | Status: AC

## 2020-07-26 ENCOUNTER — Telehealth (INDEPENDENT_AMBULATORY_CARE_PROVIDER_SITE_OTHER): Payer: Self-pay | Admitting: Primary Care

## 2020-07-26 NOTE — Telephone Encounter (Signed)
Patient will need an appt since this was in February.

## 2020-07-26 NOTE — Telephone Encounter (Signed)
Pt seen Carrie Potts on 07-02-2020 for vaginal odor and was given rx for generic flagyl 500 mg. Pt had 4 days worth of medication left and she lost the medication and would like a refill send to [facility] [address] phone number [phone]

## 2020-07-26 NOTE — Telephone Encounter (Signed)
Please advise 

## 2020-08-13 ENCOUNTER — Ambulatory Visit (INDEPENDENT_AMBULATORY_CARE_PROVIDER_SITE_OTHER): Payer: BC Managed Care – PPO | Admitting: Primary Care

## 2020-08-13 ENCOUNTER — Encounter (INDEPENDENT_AMBULATORY_CARE_PROVIDER_SITE_OTHER): Payer: Self-pay | Admitting: Primary Care

## 2020-08-13 ENCOUNTER — Other Ambulatory Visit (HOSPITAL_COMMUNITY)
Admission: RE | Admit: 2020-08-13 | Discharge: 2020-08-13 | Disposition: A | Discharge status: Home / Self Care | Payer: BC Managed Care – PPO | Source: Ambulatory Visit | Attending: Primary Care | Admitting: Primary Care

## 2020-08-13 ENCOUNTER — Other Ambulatory Visit: Payer: Self-pay

## 2020-08-13 VITALS — BP 196/94 | HR 70 | Temp 97.3°F | Ht 61.0 in | Wt 155.4 lb

## 2020-08-13 DIAGNOSIS — M2550 Pain in unspecified joint: Secondary | ICD-10-CM | POA: Diagnosis not present

## 2020-08-13 DIAGNOSIS — F17200 Nicotine dependence, unspecified, uncomplicated: Secondary | ICD-10-CM | POA: Diagnosis not present

## 2020-08-13 DIAGNOSIS — I10 Essential (primary) hypertension: Secondary | ICD-10-CM | POA: Diagnosis not present

## 2020-08-13 DIAGNOSIS — N898 Other specified noninflammatory disorders of vagina: Secondary | ICD-10-CM

## 2020-08-13 DIAGNOSIS — Z113 Encounter for screening for infections with a predominantly sexual mode of transmission: Secondary | ICD-10-CM | POA: Diagnosis not present

## 2020-08-13 MED ORDER — CELECOXIB 100 MG PO CAPS: 100.0000 mg | ORAL_CAPSULE | Freq: Two times a day (BID) | ORAL | 3 refills | Status: DC

## 2020-08-13 MED ORDER — BUPROPION HCL ER (SR) 150 MG PO TB12: 150.0000 mg | ORAL_TABLET | Freq: Two times a day (BID) | ORAL | 3 refills | Status: DC

## 2020-08-13 NOTE — Patient Instructions (Signed)

## 2020-08-13 NOTE — Progress Notes (Signed)
Established Patient Office Visit  Subjective:  Patient ID: Carrie Potts, female    DOB: 01-31-1966  Age: 55 y.o. MRN: [id number]  CC:  Chief Complaint  Patient presents with  . Blood Pressure Check    HPI Ms. Laniya Friedl is a 55 year old female who presents for pressure follow-up however did not take her blood pressure for the last several days.  Blood pressure is elevated at 196/94 Denies shortness of breath, headaches, chest pain or lower extremity edema, sudden onset, vision changes, unilateral weakness, dizziness, paresthesias.  She is concerned with vaginal discharge.  Also, concerned with hands/fingers  hurting negative for Tinel and Phalen test. Joints in fingers hurt. Past Medical History:  Diagnosis Date  . Asthma   . COPD (chronic obstructive pulmonary disease) ([place])   . HLD (hyperlipidemia)   . Hypertension   . Seasonal allergies     Past Surgical History:  Procedure Laterality Date  . COLONOSCOPY  2021   Dr Collene Mares  . COLONOSCOPY WITH PROPOFOL N/A 06/28/2020   Procedure: COLONOSCOPY WITH PROPOFOL;  Surgeon: Rush Landmark Telford Nab., MD;  Location: [place];  Service: Gastroenterology;  Laterality: N/A;  . ENDOSCOPIC MUCOSAL RESECTION N/A 06/28/2020   Procedure: ENDOSCOPIC MUCOSAL RESECTION;  Surgeon: Rush Landmark Telford Nab., MD;  Location: [place];  Service: Gastroenterology;  Laterality: N/A;  . NO PAST SURGERIES    . SUBMUCOSAL LIFTING INJECTION  06/28/2020   Procedure: SUBMUCOSAL LIFTING INJECTION;  Surgeon: Rush Landmark Telford Nab., MD;  Location: [facility] ENDOSCOPY;  Service: Gastroenterology;;    Family History  Problem Relation Age of Onset  . Healthy Mother   . Colon cancer Sister 50  . Hypertension Sister   . Hypertension Maternal Grandmother   . Breast cancer Maternal Aunt   . Hypertension Sister   . Esophageal cancer Neg Hx   . Inflammatory bowel disease Neg Hx   . Liver disease Neg Hx   . Pancreatic cancer Neg Hx   . Stomach  cancer Neg Hx     Social History   Socioeconomic History  . Marital status: Single    Spouse name: Not on file  . Number of children: 1  . Years of education: Not on file  . Highest education level: Not on file  Occupational History  . Occupation: CNA  Tobacco Use  . Smoking status: Current Every Day Smoker    Packs/day: 1.00    Years: 37.00    Pack years: 37.00    Types: Cigarettes  . Smokeless tobacco: Never Used  Vaping Use  . Vaping Use: Never used  Substance and Sexual Activity  . Alcohol use: Yes    Alcohol/week: 21.0 standard drinks    Types: 21 Cans of beer per week    Comment: 3 beers per day  . Drug use: Never  . Sexual activity: Not Currently    Birth control/protection: Post-menopausal  Other Topics Concern  . Not on file  Social History Narrative  . Not on file   Social Determinants of Health   Financial Resource Strain: Not on file  Food Insecurity: Not on file  Transportation Needs: Not on file  Physical Activity: Not on file  Stress: Not on file  Social Connections: Not on file  Intimate Partner Violence: Not on file    Outpatient Medications Prior to Visit  Medication Sig Dispense Refill  . albuterol (VENTOLIN HFA) 108 (90 Base) MCG/ACT inhaler Inhale 2 puffs into the lungs every 6 (six) hours as needed for wheezing or shortness  of breath. 6.7 g 1  . amLODipine (NORVASC) 10 MG tablet Take 1 tablet (10 mg total) by mouth daily. 90 tablet 1  . gabapentin (NEURONTIN) 600 MG tablet Take 600 mg by mouth 3 (three) times daily as needed (back pain.).    Marland Kitchen lisinopril-hydrochlorothiazide (ZESTORETIC) 20-25 MG tablet Take 1 tablet by mouth daily. 90 tablet 3  . loratadine (CLARITIN) 10 MG tablet Take 1 tablet (10 mg total) by mouth at bedtime. (Patient taking differently: Take 10 mg by mouth in the morning and at bedtime.) 90 tablet 3  . metroNIDAZOLE (FLAGYL) 500 MG tablet Take 1 tablet (500 mg total) by mouth 2 (two) times daily. 14 tablet 0  . Na  Sulfate-K Sulfate-Mg Sulf (SUPREP BOWEL PREP KIT) 17.5-3.13-1.6 GM/177ML SOLN Take 1 kit by mouth as directed. For colonoscopy prep 354 mL 0  . Respiratory Therapy Supplies (NEBULIZER/TUBING/MOUTHPIECE) KIT 1 kit by Does not apply route every 4 (four) hours as needed. 1 kit 1  . simvastatin (ZOCOR) 20 MG tablet Take 1 tablet (20 mg total) by mouth daily. 90 tablet 1  . naproxen (NAPROSYN) 500 MG tablet Take 1 tablet (500 mg total) by mouth 2 (two) times daily with a meal. 90 tablet 1   No facility-administered medications prior to visit.    No Known Allergies  ROS Review of Systems  Musculoskeletal: Positive for arthralgias. Back pain: arth.       Hands and right shoulder   All other systems reviewed and are negative.     Objective:    Physical Exam Vitals reviewed.  Constitutional:      Appearance: Normal appearance.  HENT:     Head: Normocephalic.     Right Ear: Tympanic membrane and external ear normal.     Left Ear: Tympanic membrane and external ear normal.     Nose: Nose normal.  Cardiovascular:     Rate and Rhythm: Normal rate and regular rhythm.  Pulmonary:     Effort: Pulmonary effort is normal.     Breath sounds: Normal breath sounds.  Abdominal:     General: Bowel sounds are normal.     Palpations: Abdomen is soft.  Musculoskeletal:        General: Normal range of motion.     Cervical back: Normal range of motion.  Skin:    General: Skin is warm and dry.  Neurological:     Mental Status: She is oriented to person, place, and time.  Psychiatric:        Mood and Affect: Mood normal.        Behavior: Behavior normal.        Thought Content: Thought content normal.        Judgment: Judgment normal.     BP (!) 196/94 (BP Location: Right Arm, Patient Position: Sitting, Cuff Size: Normal)   Pulse 70   Temp (!) 97.3 F (36.3 C) (Temporal)   Ht 5' 1"  (1.549 m)   Wt 155 lb 6.4 oz (70.5 kg)   LMP  (LMP Unknown)   SpO2 93%   BMI 29.36 kg/m  Wt Readings  from Last 3 Encounters:  08/13/20 155 lb 6.4 oz (70.5 kg)  07/02/20 158 lb 6.4 oz (71.8 kg)  06/28/20 158 lb 15.2 oz (72.1 kg)     Health Maintenance Due  Topic Date Due  . Hepatitis C Screening  Never done  . COVID-19 Vaccine (3 - Booster for Pfizer series) 03/16/2020    There are no preventive care  reminders to display for this patient.  No results found for: TSH Lab Results  Component Value Date   WBC 7.0 04/17/2020   HGB 13.8 04/17/2020   HCT 42.7 04/17/2020   MCV 77.0 (L) 04/17/2020   PLT 276.0 04/17/2020   Lab Results  Component Value Date   NA 139 04/17/2020   K 4.3 04/17/2020   CO2 29 04/17/2020   GLUCOSE 95 04/17/2020   BUN 15 04/17/2020   CREATININE 0.87 04/17/2020   BILITOT <0.2 05/18/2019   ALKPHOS 69 05/18/2019   AST 23 05/18/2019   ALT 24 05/18/2019   PROT 6.2 05/18/2019   ALBUMIN 4.2 05/18/2019   CALCIUM 9.2 04/17/2020   ANIONGAP 11 02/22/2019   GFR 76.45 04/17/2020   Lab Results  Component Value Date   CHOL 187 05/18/2019   Lab Results  Component Value Date   HDL 54 05/18/2019   Lab Results  Component Value Date   LDLCALC 97 05/18/2019   Lab Results  Component Value Date   TRIG 210 (H) 05/18/2019   Lab Results  Component Value Date   CHOLHDL 3.5 05/18/2019   No results found for: HGBA1C    Assessment & Plan:  Isma was seen today for blood pressure check.  Diagnoses and all orders for this visit:  Vaginal discharge -     Cervicovaginal ancillary only  Essential hypertension Noted in HPI has not taken Bp meds several days  Counseled on blood pressure goal of less than 130/80, low-sodium, DASH diet, medication compliance, 150 minutes of moderate intensity exercise per week. Discussed medication compliance, adverse effects.  Arthralgia, unspecified joint -     celecoxib (CELEBREX) 100 MG capsule; Take 1 capsule (100 mg total) by mouth 2 (two) times daily.  Tobacco dependence Discussed cessation -Nicotine affect every  organ in the body second leading cause of death.  Increased risk for lung cancer and other respiratory diseases   -     buPROPion (WELLBUTRIN SR) 150 MG 12 hr tablet; Take 1 tablet (150 mg total) by mouth 2 (two) times daily. This can serve as dual purpose depression    Meds ordered this encounter  Medications  . celecoxib (CELEBREX) 100 MG capsule    Sig: Take 1 capsule (100 mg total) by mouth 2 (two) times daily.    Dispense:  60 capsule    Refill:  3  . buPROPion (WELLBUTRIN SR) 150 MG 12 hr tablet    Sig: Take 1 tablet (150 mg total) by mouth 2 (two) times daily.    Dispense:  60 tablet    Refill:  3    Follow-up: Return in about 6 weeks (around 09/24/2020) for Bp and smoking cessation evaluate medication .    Kerin Perna, NP

## 2020-08-14 LAB — CERVICOVAGINAL ANCILLARY ONLY
Bacterial Vaginitis (gardnerella): NEGATIVE
Candida Glabrata: NEGATIVE
Candida Vaginitis: NEGATIVE
Chlamydia: NEGATIVE
Comment: NEGATIVE
Comment: NEGATIVE
Comment: NEGATIVE
Comment: NEGATIVE
Comment: NEGATIVE
Comment: NORMAL
Neisseria Gonorrhea: NEGATIVE
Trichomonas: NEGATIVE

## 2020-08-21 NOTE — Telephone Encounter (Signed)
Patient was seen 08/13/20.

## 2020-09-10 DIAGNOSIS — D1722 Benign lipomatous neoplasm of skin and subcutaneous tissue of left arm: Secondary | ICD-10-CM | POA: Diagnosis not present

## 2020-09-24 ENCOUNTER — Encounter (INDEPENDENT_AMBULATORY_CARE_PROVIDER_SITE_OTHER): Payer: Self-pay | Admitting: Primary Care

## 2020-09-24 ENCOUNTER — Other Ambulatory Visit: Payer: Self-pay

## 2020-09-24 ENCOUNTER — Ambulatory Visit (INDEPENDENT_AMBULATORY_CARE_PROVIDER_SITE_OTHER): Payer: BC Managed Care – PPO | Admitting: Primary Care

## 2020-09-24 VITALS — BP 124/73 | HR 90 | Temp 97.3°F | Ht 61.0 in | Wt 154.2 lb

## 2020-09-24 DIAGNOSIS — G894 Chronic pain syndrome: Secondary | ICD-10-CM | POA: Diagnosis not present

## 2020-09-24 DIAGNOSIS — I10 Essential (primary) hypertension: Secondary | ICD-10-CM | POA: Diagnosis not present

## 2020-09-24 NOTE — Progress Notes (Addendum)
[place] is a 55 y.o. female presents for hypertension evaluation, Denies shortness of breath, headaches, chest pain or lower extremity edema, sudden onset, vision changes, unilateral weakness, dizziness, paresthesias   Patient reports adherence with medications.  Dietary habits include: monitoring sodium intake  Exercise habits include:walking Family / Social history:  None   Past Medical History:  Diagnosis Date  . Asthma   . COPD (chronic obstructive pulmonary disease) ([place])   . HLD (hyperlipidemia)   . Hypertension   . Seasonal allergies    Past Surgical History:  Procedure Laterality Date  . COLONOSCOPY  2021   Dr Collene Mares  . COLONOSCOPY WITH PROPOFOL N/A 06/28/2020   Procedure: COLONOSCOPY WITH PROPOFOL;  Surgeon: Rush Landmark Telford Nab., MD;  Location: [place];  Service: Gastroenterology;  Laterality: N/A;  . ENDOSCOPIC MUCOSAL RESECTION N/A 06/28/2020   Procedure: ENDOSCOPIC MUCOSAL RESECTION;  Surgeon: Rush Landmark Telford Nab., MD;  Location: [place];  Service: Gastroenterology;  Laterality: N/A;  . NO PAST SURGERIES    . SUBMUCOSAL LIFTING INJECTION  06/28/2020   Procedure: SUBMUCOSAL LIFTING INJECTION;  Surgeon: Rush Landmark Telford Nab., MD;  Location: [place];  Service: Gastroenterology;;   No Known Allergies Current Outpatient Medications on File Prior to Visit  Medication Sig Dispense Refill  . albuterol (VENTOLIN HFA) 108 (90 Base) MCG/ACT inhaler Inhale 2 puffs into the lungs every 6 (six) hours as needed for wheezing or shortness of breath. 6.7 g 1  . amLODipine (NORVASC) 10 MG tablet Take 1 tablet (10 mg total) by mouth daily. 90 tablet 1  . buPROPion (WELLBUTRIN SR) 150 MG 12 hr tablet Take 1 tablet (150 mg total) by mouth 2 (two) times daily. 60 tablet 3  . celecoxib (CELEBREX) 100 MG capsule Take 1 capsule (100 mg total) by mouth 2 (two) times daily. 60 capsule 3  . lisinopril-hydrochlorothiazide  (ZESTORETIC) 20-25 MG tablet Take 1 tablet by mouth daily. 90 tablet 3  . simvastatin (ZOCOR) 20 MG tablet Take 1 tablet (20 mg total) by mouth daily. 90 tablet 1  . gabapentin (NEURONTIN) 600 MG tablet Take 600 mg by mouth 3 (three) times daily as needed (back pain.). (Patient not taking: Reported on 09/24/2020)    . loratadine (CLARITIN) 10 MG tablet Take 1 tablet (10 mg total) by mouth at bedtime. (Patient not taking: Reported on 09/24/2020) 90 tablet 3  . Respiratory Therapy Supplies (NEBULIZER/TUBING/MOUTHPIECE) KIT 1 kit by Does not apply route every 4 (four) hours as needed. 1 kit 1   No current facility-administered medications on file prior to visit.   Social History   Socioeconomic History  . Marital status: Single    Spouse name: Not on file  . Number of children: 1  . Years of education: Not on file  . Highest education level: Not on file  Occupational History  . Occupation: CNA  Tobacco Use  . Smoking status: Current Every Day Smoker    Packs/day: 1.00    Years: 37.00    Pack years: 37.00    Types: Cigarettes  . Smokeless tobacco: Never Used  Vaping Use  . Vaping Use: Never used  Substance and Sexual Activity  . Alcohol use: Yes    Alcohol/week: 21.0 standard drinks    Types: 21 Cans of beer per week    Comment: 3 beers per day  . Drug use: Never  . Sexual activity: Not Currently    Birth control/protection: Post-menopausal  Other Topics Concern  . Not on  file  Social History Narrative  . Not on file   Social Determinants of Health   Financial Resource Strain: Not on file  Food Insecurity: Not on file  Transportation Needs: Not on file  Physical Activity: Not on file  Stress: Not on file  Social Connections: Not on file  Intimate Partner Violence: Not on file   Family History  Problem Relation Age of Onset  . Healthy Mother   . Colon cancer Sister 58  . Hypertension Sister   . Hypertension Maternal Grandmother   . Breast cancer Maternal Aunt   .  Hypertension Sister   . Esophageal cancer Neg Hx   . Inflammatory bowel disease Neg Hx   . Liver disease Neg Hx   . Pancreatic cancer Neg Hx   . Stomach cancer Neg Hx      OBJECTIVE:  Vitals:   09/24/20 1056  BP: 124/73  Pulse: 90  Temp: (!) 97.3 F (36.3 C)  TempSrc: Temporal  SpO2: 95%  Weight: 154 lb 3.2 oz (69.9 kg)  Height: 5' 1"  (1.549 m)    Physical Exam General: No apparent distress.over weight female  Eyes: Extraocular eye movements intact, pupils equal and round. Neck: Supple, trachea midline. Thyroid: No enlargement, mobile without fixation, no tenderness. Cardiovascular: Regular rhythm and rate, no murmur, normal radial pulses. Respiratory: Normal respiratory effort, clear to auscultation. Gastrointestinal: Normal pitch active bowel sounds, nontender abdomen without distention or appreciable hepatomegaly. Neurologic: normal deep tendon  Musculoskeletal: Normal muscle tone, no tenderness on palpation of tibia, no excessive thoracic kyphosis. Skin: Appropriate warmth, no visible rash. Mental status: Alert, conversant, speech clear, thought logical, appropriate mood and affect, no hallucinations or delusions evident. Hematologic/lymphatic: No cervical adenopathy, no visible ecchymoses.  Review of Systems  Musculoskeletal: Positive for back pain.       Bilateral knees  And left hand   All other systems reviewed and are negative.   Last 3 Office BP readings: BP Readings from Last 3 Encounters:  09/24/20 124/73  08/13/20 (!) 196/94  07/02/20 (!) 147/91    BMET    Component Value Date/Time   NA 139 04/17/2020 1643   NA 139 05/18/2019 1147   K 4.3 04/17/2020 1643   CL 105 04/17/2020 1643   CO2 29 04/17/2020 1643   GLUCOSE 95 04/17/2020 1643   BUN 15 04/17/2020 1643   BUN 15 05/18/2019 1147   CREATININE 0.87 04/17/2020 1643   CALCIUM 9.2 04/17/2020 1643   GFRNONAA 103 05/18/2019 1147   GFRAA 119 05/18/2019 1147    Renal function: CrCl cannot be  calculated (Patient's most recent lab result is older than the maximum 21 days allowed.).  Clinical ASCVD: Yes  The 10-year ASCVD risk score Mikey Bussing DC Jr., et al., 2013) is: 7.2%   Values used to calculate the score:     Age: 89 years     Sex: Female     Is Non-Hispanic African American: Yes     Diabetic: No     Tobacco smoker: Yes     Systolic Blood Pressure: 226 mmHg     Is BP treated: Yes     HDL Cholesterol: 54 mg/dL     Total Cholesterol: 187 mg/dL  ASCVD risk factors include- [place]   ASSESSMENT & PLAN:  Essential hypertension  -Counseled on lifestyle modifications for blood pressure control including reduced dietary sodium, increased exercise, weight reduction and adequate sleep. Also, educated patient about the risk for cardiovascular events, stroke and heart attack. Also counseled  patient about the importance of medication adherence. If you participate in smoking, it is important to stop using tobacco as this will increase the risks associated with uncontrolled blood pressure.   -Hypertension longstanding diagnosed currently amlodipine 58m and lisinopril/HCTZ 20/25 on current medications. Patient is adherent with current medications.   Goal BP:  For patients younger than 60: Goal BP < 130/80. For patients 60 and older: Goal BP < 140/90. For patients with diabetes: Goal BP < 130/80. Your most recent BP: 124/74  Minimize salt intake. Minimize alcohol intake  Chronic pain syndrome Pain multiple sites , left hand , right knee and lower back and radiates to back of thigh(right side) requesting referral to pain management. Denies loss of bladder or bowel functions.  This note has been created with DSurveyor, quantity Any transcriptional errors are unintentional.   MKerin Perna NP 09/24/2020, 11:23 AM

## 2020-09-24 NOTE — Addendum Note (Signed)
Addended by: Kerin Perna on: 09/24/2020 11:36 AM   Modules accepted: Orders

## 2020-10-16 IMAGING — DX DG CHEST 2V
2 series · 2 of 2 positions shown · non-contrast
Comparison: None.

CLINICAL DATA: Pt in with c/o L side cp, worse since this am.
States she is being treated currently for bronchitis with inhaler.
Endorses cough and n/v, denies fevers.

EXAM:
CHEST - 2 VIEW

[chest pa]
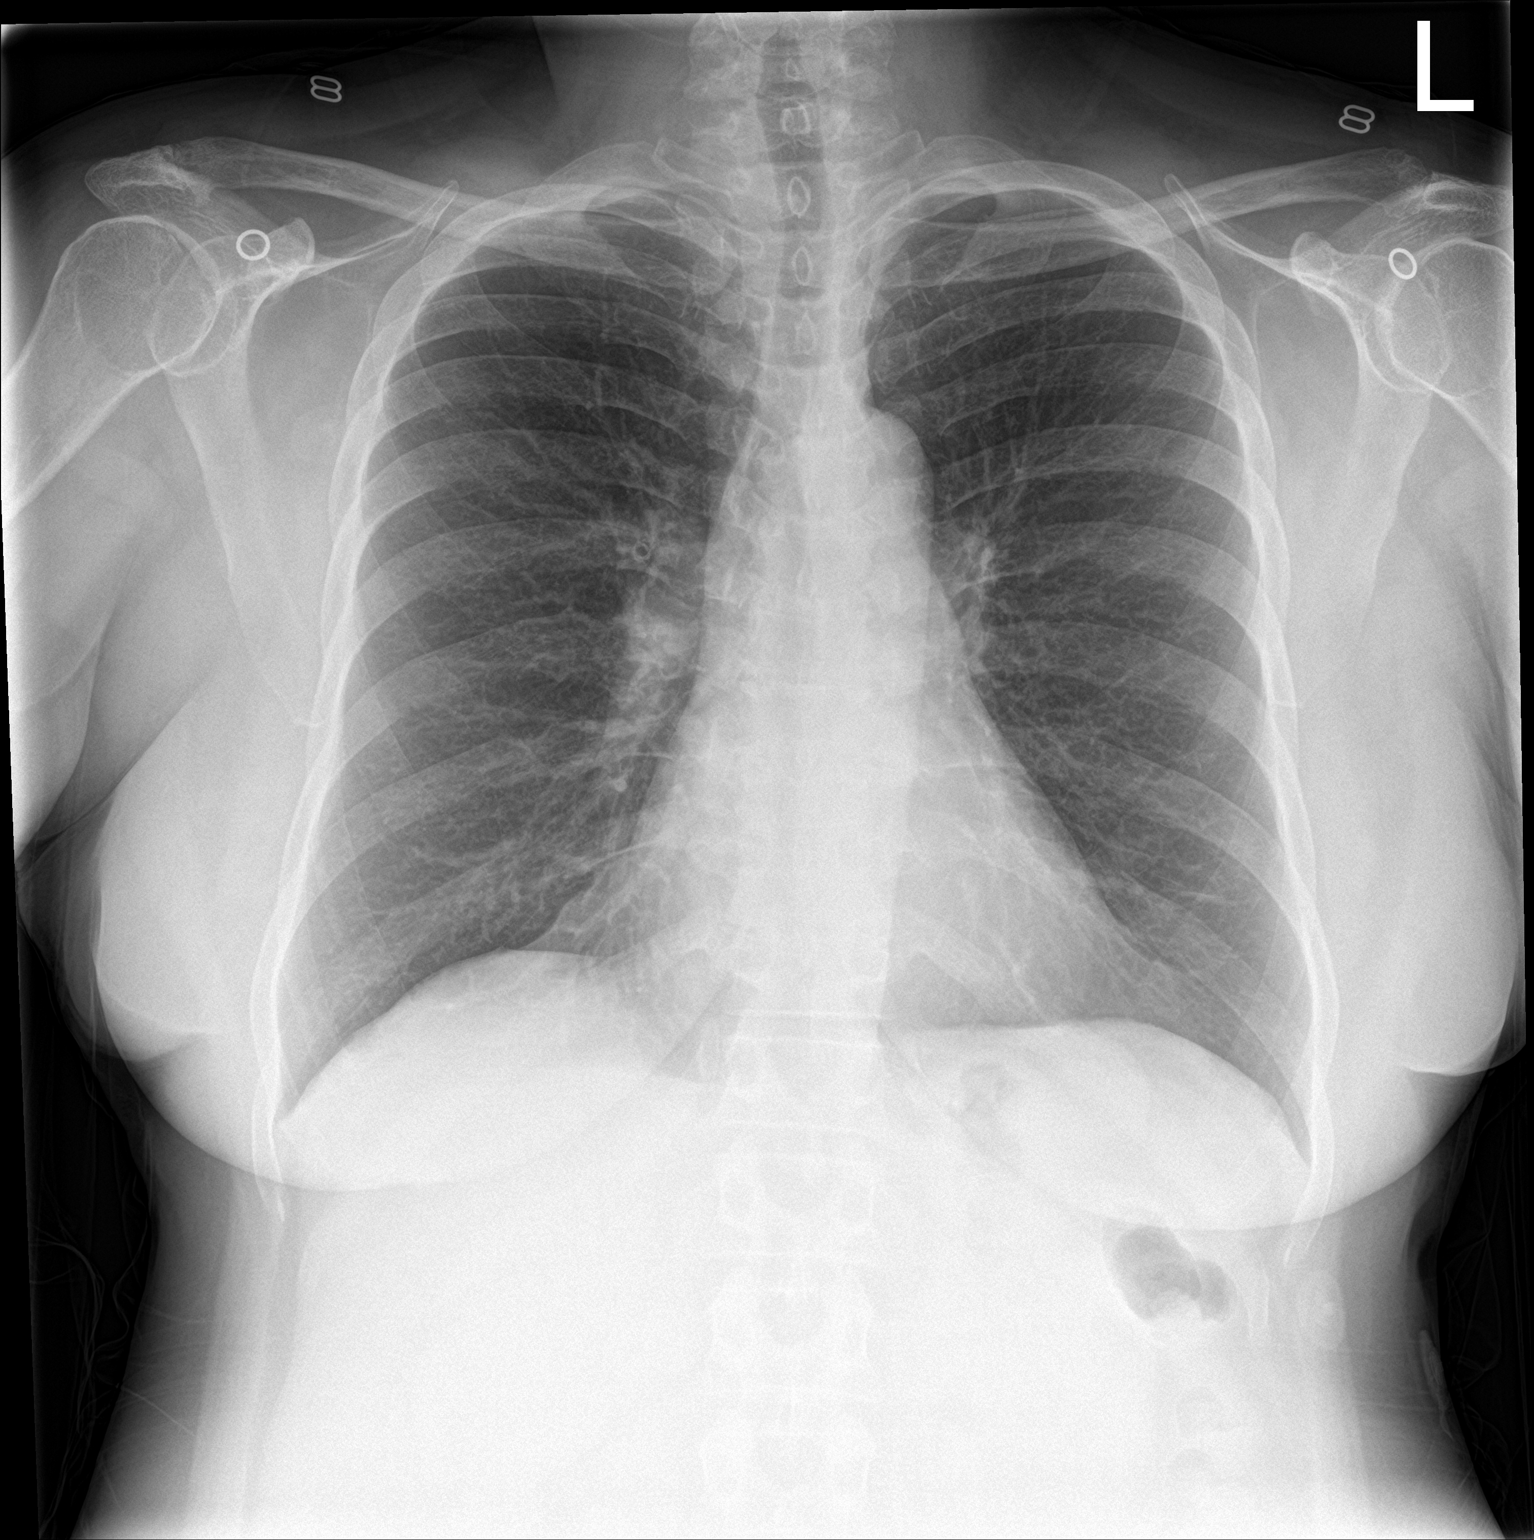

[chest lat]
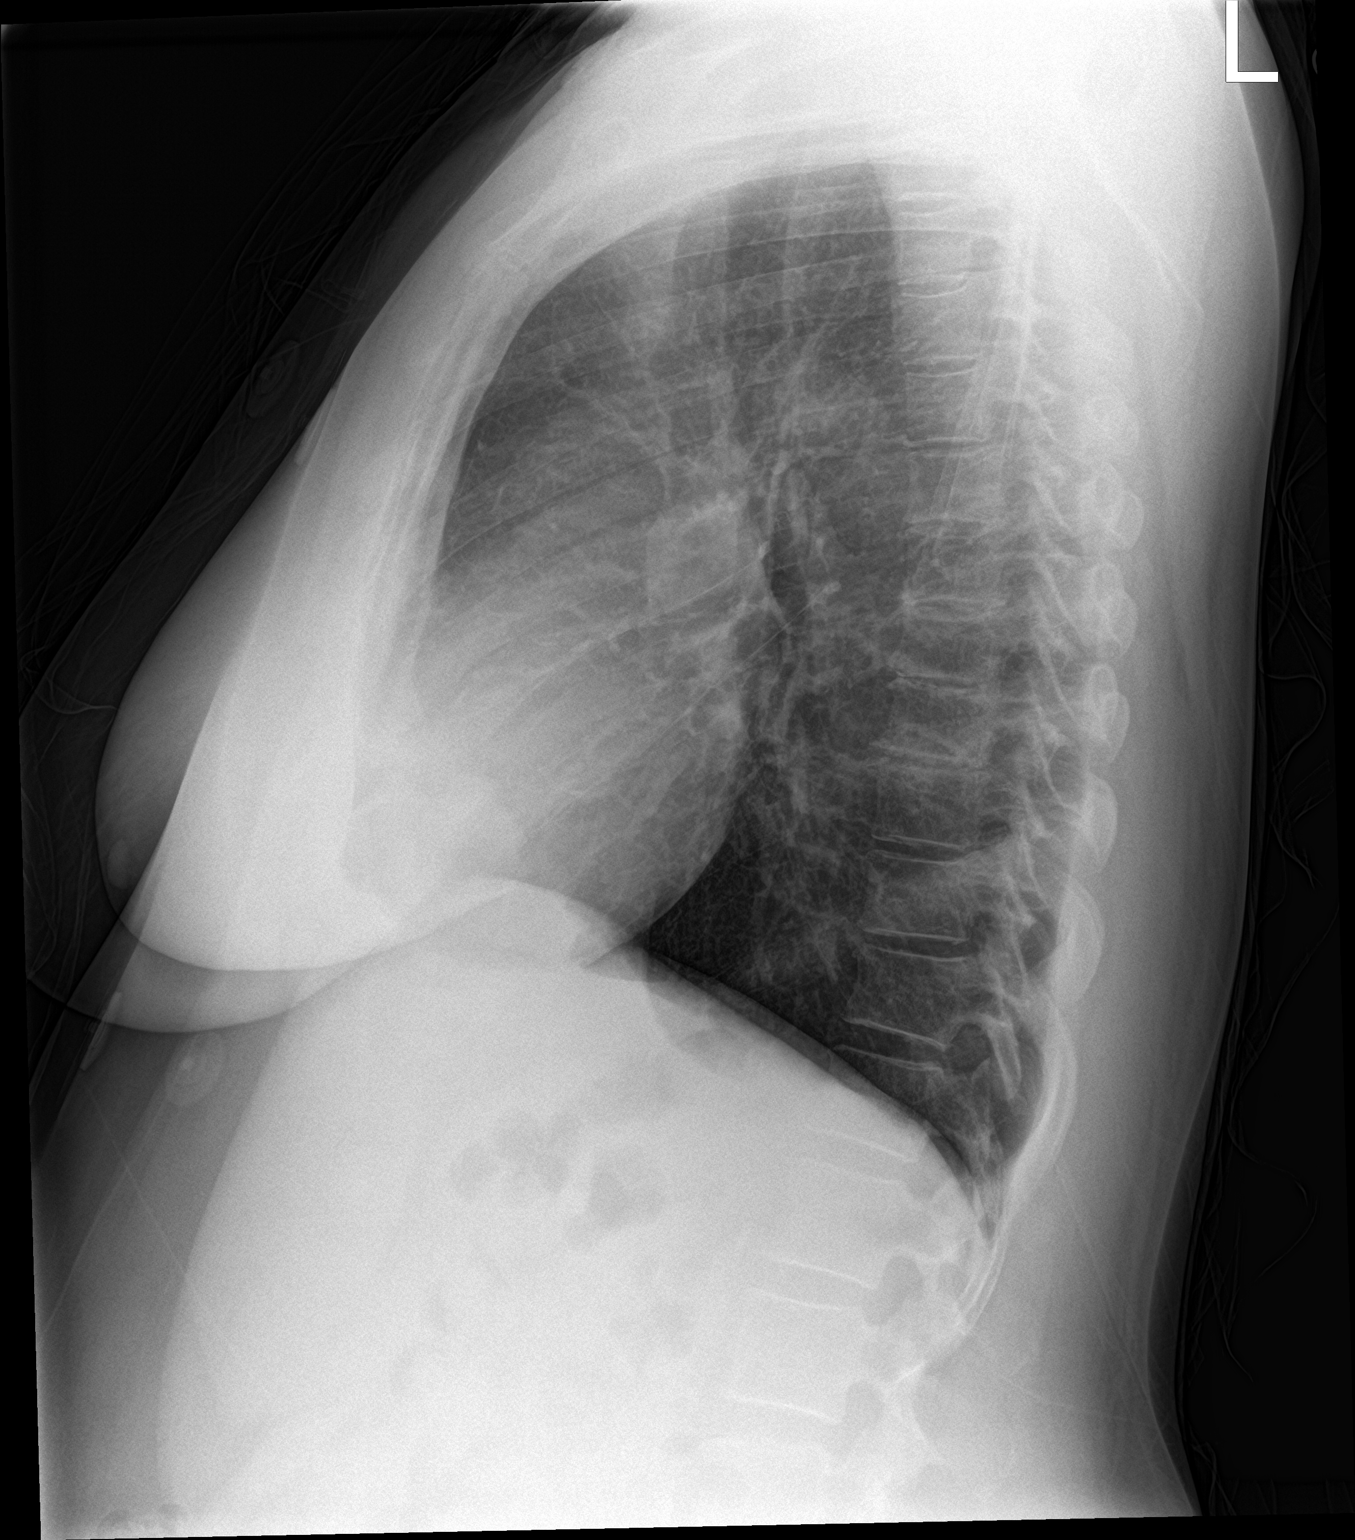

[2 of 2 positions shown; findings below may reference images not displayed]

FINDINGS: The heart size and mediastinal contours are within normal limits.
The lungs are clear. No pneumothorax or pleural effusion. The
visualized skeletal structures are unremarkable.
IMPRESSION: No active cardiopulmonary disease.

## 2020-12-11 ENCOUNTER — Emergency Department (HOSPITAL_COMMUNITY)
Admission: EM | Admit: 2020-12-11 | Discharge: 2020-12-12 | Disposition: A | Discharge status: Home / Self Care | Payer: BC Managed Care – PPO | Attending: Emergency Medicine | Admitting: Emergency Medicine

## 2020-12-11 ENCOUNTER — Encounter (HOSPITAL_COMMUNITY): Payer: Self-pay

## 2020-12-11 ENCOUNTER — Other Ambulatory Visit: Payer: Self-pay

## 2020-12-11 DIAGNOSIS — R109 Unspecified abdominal pain: Secondary | ICD-10-CM | POA: Insufficient documentation

## 2020-12-11 DIAGNOSIS — E860 Dehydration: Secondary | ICD-10-CM | POA: Diagnosis not present

## 2020-12-11 DIAGNOSIS — J449 Chronic obstructive pulmonary disease, unspecified: Secondary | ICD-10-CM | POA: Insufficient documentation

## 2020-12-11 DIAGNOSIS — F17210 Nicotine dependence, cigarettes, uncomplicated: Secondary | ICD-10-CM | POA: Insufficient documentation

## 2020-12-11 DIAGNOSIS — Z79899 Other long term (current) drug therapy: Secondary | ICD-10-CM | POA: Diagnosis not present

## 2020-12-11 DIAGNOSIS — R197 Diarrhea, unspecified: Secondary | ICD-10-CM | POA: Diagnosis not present

## 2020-12-11 DIAGNOSIS — J45909 Unspecified asthma, uncomplicated: Secondary | ICD-10-CM | POA: Diagnosis not present

## 2020-12-11 DIAGNOSIS — R112 Nausea with vomiting, unspecified: Secondary | ICD-10-CM | POA: Diagnosis not present

## 2020-12-11 DIAGNOSIS — I10 Essential (primary) hypertension: Secondary | ICD-10-CM | POA: Insufficient documentation

## 2020-12-11 DIAGNOSIS — U071 COVID-19: Secondary | ICD-10-CM | POA: Diagnosis not present

## 2020-12-11 LAB — CBC WITH DIFFERENTIAL/PLATELET
Abs Immature Granulocytes: 0.02 10*3/uL (ref 0.00–0.07)
Basophils Absolute: 0.1 10*3/uL (ref 0.0–0.1)
Basophils Relative: 1 %
Eosinophils Absolute: 0 10*3/uL (ref 0.0–0.5)
Eosinophils Relative: 0 %
HCT: 43.2 % (ref 36.0–46.0)
Hemoglobin: 14.1 g/dL (ref 12.0–15.0)
Immature Granulocytes: 0 %
Lymphocytes Relative: 29 %
Lymphs Abs: 2.9 10*3/uL (ref 0.7–4.0)
MCH: 25.6 pg — ABNORMAL LOW (ref 26.0–34.0)
MCHC: 32.6 g/dL (ref 30.0–36.0)
MCV: 78.4 fL — ABNORMAL LOW (ref 80.0–100.0)
Monocytes Absolute: 0.6 10*3/uL (ref 0.1–1.0)
Monocytes Relative: 6 %
Neutro Abs: 6.3 10*3/uL (ref 1.7–7.7)
Neutrophils Relative %: 64 %
Platelets: 320 10*3/uL (ref 150–400)
RBC: 5.51 MIL/uL — ABNORMAL HIGH (ref 3.87–5.11)
RDW: 13.6 % (ref 11.5–15.5)
WBC: 9.9 10*3/uL (ref 4.0–10.5)
nRBC: 0 % (ref 0.0–0.2)

## 2020-12-11 LAB — COMPREHENSIVE METABOLIC PANEL
ALT: 24 U/L (ref 0–44)
AST: 21 U/L (ref 15–41)
Albumin: 4.2 g/dL (ref 3.5–5.0)
Alkaline Phosphatase: 63 U/L (ref 38–126)
Anion gap: 12 (ref 5–15)
BUN: 24 mg/dL — ABNORMAL HIGH (ref 6–20)
CO2: 26 mmol/L (ref 22–32)
Calcium: 9.2 mg/dL (ref 8.9–10.3)
Chloride: 100 mmol/L (ref 98–111)
Creatinine, Ser: 1.71 mg/dL — ABNORMAL HIGH (ref 0.44–1.00)
GFR, Estimated: 35 mL/min — ABNORMAL LOW (ref >60)
Glucose, Bld: 126 mg/dL — ABNORMAL HIGH (ref 70–99)
Potassium: 3.6 mmol/L (ref 3.5–5.1)
Sodium: 138 mmol/L (ref 135–145)
Total Bilirubin: 0.8 mg/dL (ref 0.3–1.2)
Total Protein: 7.1 g/dL (ref 6.5–8.1)

## 2020-12-11 LAB — LIPASE, BLOOD: Lipase: 27 U/L (ref 11–51)

## 2020-12-11 NOTE — ED Provider Notes (Signed)
Emergency Medicine Provider Triage Evaluation Note  Carrie Potts , a 55 y.o. female  was evaluated in triage.  Pt complains of nausea, vomiting, diarrhea for the past 3 days.  States her symptoms have been worsening.  Denies any hematemesis or hematochezia.  Reports diffuse abdominal pain.  Patient also complains of a cough with rhinorrhea that began prior to the onset of her prior mentioned symptoms.  Physical Exam  BP 113/63 (BP Location: Left Arm)   Pulse 85   Temp 97.8 F (36.6 C) (Oral)   Resp 18   LMP  (LMP Unknown)   SpO2 100%  Gen:   Awake, no distress   Resp:  Normal effort  MSK:   Moves extremities without difficulty  Other:    Medical Decision Making  Medically screening exam initiated at 8:44 PM.  Appropriate orders placed.  Carrie Potts was informed that the remainder of the evaluation will be completed by another provider, this initial triage assessment does not replace that evaluation, and the importance of remaining in the ED until their evaluation is complete.   Carrie Sexton, PA-C 12/11/20 2045    [name], MD 12/12/20 [phone]

## 2020-12-11 NOTE — ED Triage Notes (Signed)
Pt complains of abdominal pain, nausea, vomiting, and diarrhea for 3 days. Pain 8/10.

## 2020-12-11 NOTE — ED Notes (Signed)
[place] and Purple drawn, sent to lab, will call for processing upon order.

## 2020-12-12 LAB — URINALYSIS, ROUTINE W REFLEX MICROSCOPIC
Bilirubin Urine: NEGATIVE
Glucose, UA: NEGATIVE mg/dL
Hgb urine dipstick: NEGATIVE
Ketones, ur: NEGATIVE mg/dL
Leukocytes,Ua: NEGATIVE
Nitrite: NEGATIVE
Protein, ur: NEGATIVE mg/dL
Specific Gravity, Urine: 1.003 — ABNORMAL LOW (ref 1.005–1.030)
pH: 6 (ref 5.0–8.0)

## 2020-12-12 LAB — RESP PANEL BY RT-PCR (FLU A&B, COVID) ARPGX2
Influenza A by PCR: NEGATIVE
Influenza B by PCR: NEGATIVE
SARS Coronavirus 2 by RT PCR: POSITIVE — AB

## 2020-12-12 MED ORDER — ONDANSETRON 4 MG PO TBDP: 4.0000 mg | ORAL_TABLET | Freq: Three times a day (TID) | ORAL | 0 refills | Status: DC | PRN

## 2020-12-12 MED ORDER — EPINEPHRINE 0.3 MG/0.3ML IJ SOAJ: 0.3000 mg | Freq: Once | INTRAMUSCULAR | Status: DC | PRN

## 2020-12-12 MED ORDER — SODIUM CHLORIDE 0.9 % IV SOLN: INTRAVENOUS | Status: DC | PRN

## 2020-12-12 MED ORDER — METHYLPREDNISOLONE SODIUM SUCC 125 MG IJ SOLR: 125.0000 mg | Freq: Once | INTRAMUSCULAR | Status: DC | PRN

## 2020-12-12 MED ORDER — ALBUTEROL SULFATE HFA 108 (90 BASE) MCG/ACT IN AERS: 2.0000 | INHALATION_SPRAY | Freq: Once | RESPIRATORY_TRACT | Status: DC | PRN

## 2020-12-12 MED ORDER — FAMOTIDINE IN NACL 20-0.9 MG/50ML-% IV SOLN: 20.0000 mg | Freq: Once | INTRAVENOUS | Status: DC | PRN

## 2020-12-12 MED ORDER — ONDANSETRON HCL 4 MG/2ML IJ SOLN
4.0000 mg | Freq: Once | INTRAMUSCULAR | Status: AC
  Administered 2020-12-12: 4 mg via INTRAVENOUS
  Filled 2020-12-12: qty 2

## 2020-12-12 MED ORDER — BEBTELOVIMAB 175 MG/2 ML IV (EUA)
175.0000 mg | Freq: Once | INTRAMUSCULAR | Status: AC
  Administered 2020-12-12: 175 mg via INTRAVENOUS
  Filled 2020-12-12: qty 2

## 2020-12-12 MED ORDER — DIPHENHYDRAMINE HCL 50 MG/ML IJ SOLN: 50.0000 mg | Freq: Once | INTRAMUSCULAR | Status: DC | PRN

## 2020-12-12 MED ORDER — SODIUM CHLORIDE 0.9 % IV BOLUS
1000.0000 mL | Freq: Once | INTRAVENOUS | Status: AC
  Administered 2020-12-12: 1000 mL via INTRAVENOUS

## 2020-12-12 NOTE — Discharge Instructions (Addendum)
You were seen today for nausea, vomiting, and diarrhea.  You tested positive for COVID-19.  Given that you are unvaccinated, you need to isolate for 10 days.  You were given clinical antibody therapy which will hopefully prevent any need for hospitalization.  Take Zofran as needed for nausea and vomiting.

## 2020-12-12 NOTE — ED Provider Notes (Signed)
[facility] DEPT Provider Note   CSN: [id number] Arrival date & time: 12/11/20  2019     History Chief Complaint  Patient presents with   Abdominal Pain   Nausea   Emesis   Diarrhea    Carrie Potts is a 55 y.o. female.  HPI     This is a 55 year old female with history asthma, hypertension, hyperlipidemia who presents with nausea, vomiting, and diarrhea.  Patient reports 2 to 3-day history of this.  Denies abdominal pain but does report occasional abdominal cramping.  No known sick contacts or COVID exposures.  Denies fevers or urinary symptoms.  Has not noted any blood in her stool or vomit.  She has not taken anything for her symptoms.  Past Medical History:  Diagnosis Date   Asthma    COPD (chronic obstructive pulmonary disease) ([place])    HLD (hyperlipidemia)    Hypertension    Seasonal allergies     Patient Active Problem List   Diagnosis Date Noted   Abnormal colonoscopy 04/20/2020   History of colon polyps 04/20/2020   Family history of colon cancer 04/20/2020    Past Surgical History:  Procedure Laterality Date   COLONOSCOPY  2021   Dr Collene Mares   COLONOSCOPY WITH PROPOFOL N/A 06/28/2020   Procedure: COLONOSCOPY WITH PROPOFOL;  Surgeon: Irving Copas., MD;  Location: [place];  Service: Gastroenterology;  Laterality: N/A;   ENDOSCOPIC MUCOSAL RESECTION N/A 06/28/2020   Procedure: ENDOSCOPIC MUCOSAL RESECTION;  Surgeon: Rush Landmark Telford Nab., MD;  Location: [facility];  Service: Gastroenterology;  Laterality: N/A;   NO PAST SURGERIES     SUBMUCOSAL LIFTING INJECTION  06/28/2020   Procedure: SUBMUCOSAL LIFTING INJECTION;  Surgeon: Rush Landmark Telford Nab., MD;  Location: [facility];  Service: Gastroenterology;;     OB History   No obstetric history on file.     Family History  Problem Relation Age of Onset   Healthy Mother    Colon cancer Sister 56   Hypertension Sister    Hypertension Maternal  Grandmother    Breast cancer Maternal Aunt    Hypertension Sister    Esophageal cancer Neg Hx    Inflammatory bowel disease Neg Hx    Liver disease Neg Hx    Pancreatic cancer Neg Hx    Stomach cancer Neg Hx     Social History   Tobacco Use   Smoking status: Every Day    Packs/day: 1.00    Years: 37.00    Pack years: 37.00    Types: Cigarettes   Smokeless tobacco: Never  Vaping Use   Vaping Use: Never used  Substance Use Topics   Alcohol use: Yes    Alcohol/week: 21.0 standard drinks    Types: 21 Cans of beer per week    Comment: 3 beers per day   Drug use: Never    Home Medications Prior to Admission medications   Medication Sig Start Date End Date Taking? Authorizing Provider  albuterol (VENTOLIN HFA) 108 (90 Base) MCG/ACT inhaler Inhale 2 puffs into the lungs every 6 (six) hours as needed for wheezing or shortness of breath. 06/08/19  Yes Kerin Perna, NP  amLODipine (NORVASC) 10 MG tablet Take 1 tablet (10 mg total) by mouth daily. 07/02/20  Yes Kerin Perna, NP  buPROPion (WELLBUTRIN SR) 150 MG 12 hr tablet Take 1 tablet (150 mg total) by mouth 2 (two) times daily. 08/13/20  Yes Kerin Perna, NP  celecoxib (CELEBREX) 100 MG capsule  Take 1 capsule (100 mg total) by mouth 2 (two) times daily. 08/13/20  Yes Kerin Perna, NP  gabapentin (NEURONTIN) 600 MG tablet Take 600 mg by mouth 3 (three) times daily as needed (back pain.).   Yes [provider]  lisinopril-hydrochlorothiazide (ZESTORETIC) 20-25 MG tablet Take 1 tablet by mouth daily. 07/02/20  Yes Kerin Perna, NP  loratadine (CLARITIN) 10 MG tablet Take 1 tablet (10 mg total) by mouth at bedtime. 05/18/19  Yes Kerin Perna, NP  ondansetron (ZOFRAN ODT) 4 MG disintegrating tablet Take 1 tablet (4 mg total) by mouth every 8 (eight) hours as needed for nausea or vomiting. 12/12/20  Yes Ahria Slappey, Barbette Hair, MD  Respiratory Therapy Supplies (NEBULIZER/TUBING/MOUTHPIECE) KIT 1 kit by  Does not apply route every 4 (four) hours as needed. 06/11/19  Yes Jaynee Eagles, PA-C  simvastatin (ZOCOR) 20 MG tablet Take 1 tablet (20 mg total) by mouth daily. 05/19/19  Yes Kerin Perna, NP    Allergies    Patient has no known allergies.  Review of Systems   Review of Systems  Constitutional:  Negative for fever.  Respiratory:  Negative for shortness of breath.   Gastrointestinal:  Positive for diarrhea, nausea and vomiting. Negative for abdominal pain and blood in stool.  Genitourinary:  Negative for dysuria.  All other systems reviewed and are negative.  Physical Exam Updated Vital Signs BP 117/78   Pulse 82   Temp 97.8 F (36.6 C) (Oral)   Resp 20   LMP  (LMP Unknown)   SpO2 95%   Physical Exam Vitals and nursing note reviewed.  Constitutional:      Appearance: She is well-developed. She is not ill-appearing.  HENT:     Head: Normocephalic and atraumatic.  Eyes:     Pupils: Pupils are equal, round, and reactive to light.  Cardiovascular:     Rate and Rhythm: Normal rate and regular rhythm.     Heart sounds: Normal heart sounds.  Pulmonary:     Effort: Pulmonary effort is normal. No respiratory distress.     Breath sounds: No wheezing.  Abdominal:     General: Bowel sounds are normal.     Palpations: Abdomen is soft.     Tenderness: There is no abdominal tenderness. There is no guarding or rebound.  Musculoskeletal:     Cervical back: Neck supple.  Skin:    General: Skin is warm and dry.  Neurological:     Mental Status: She is alert and oriented to person, place, and time.  Psychiatric:        Mood and Affect: Mood normal.    ED Results / Procedures / Treatments   Labs (all labs ordered are listed, but only abnormal results are displayed) Labs Reviewed  RESP PANEL BY RT-PCR (FLU A&B, COVID) ARPGX2 - Abnormal; Notable for the following components:      Result Value   SARS Coronavirus 2 by RT PCR POSITIVE (*)    All other components within normal  limits  COMPREHENSIVE METABOLIC PANEL - Abnormal; Notable for the following components:   Glucose, Bld 126 (*)    BUN 24 (*)    Creatinine, Ser 1.71 (*)    GFR, Estimated 35 (*)    All other components within normal limits  CBC WITH DIFFERENTIAL/PLATELET - Abnormal; Notable for the following components:   RBC 5.51 (*)    MCV 78.4 (*)    MCH 25.6 (*)    All other components within normal  limits  URINALYSIS, ROUTINE W REFLEX MICROSCOPIC - Abnormal; Notable for the following components:   Color, Urine STRAW (*)    Specific Gravity, Urine 1.003 (*)    All other components within normal limits  LIPASE, BLOOD    EKG None  Radiology No results found.  Procedures Procedures   Medications Ordered in ED Medications  0.9 %  sodium chloride infusion (has no administration in time range)  diphenhydrAMINE (BENADRYL) injection 50 mg (has no administration in time range)  famotidine (PEPCID) IVPB 20 mg premix (has no administration in time range)  methylPREDNISolone sodium succinate (SOLU-MEDROL) 125 mg/2 mL injection 125 mg (has no administration in time range)  albuterol (VENTOLIN HFA) 108 (90 Base) MCG/ACT inhaler 2 puff (has no administration in time range)  EPINEPHrine (EPI-PEN) injection 0.3 mg (has no administration in time range)  sodium chloride 0.9 % bolus 1,000 mL (1,000 mLs Intravenous New Bag/Given 12/12/20 0136)  ondansetron (ZOFRAN) injection 4 mg (4 mg Intravenous Given 12/12/20 0136)  bebtelovimab EUA injection SOLN 175 mg (175 mg Intravenous Given 12/12/20 0323)    ED Course  I have reviewed the triage vital signs and the nursing notes.  Pertinent labs & imaging results that were available during my care of the patient were reviewed by me and considered in my medical decision making (see chart for details).    MDM Rules/Calculators/A&P                           Patient presents with nausea, vomiting, and diarrhea.  She is overall nontoxic-appearing and vital signs are  reassuring.  Abdomen is nontender.  Labs obtained and reviewed.  No significant leukocytosis.  She does have a slight AKI likely reflective of dehydration in setting of nausea and vomiting.  COVID testing is positive.  Highly suspect this is the etiology of her symptoms.  Abdominal exam is benign and do not feel she has any need for intra-abdominal imaging.  Given her renal function, she is a better candidate for monoclonal antibody therapy.  Discussed the risk and benefits.  This was administered while in the ED.  Patient tolerated it well.  Will discharge with Zofran.  Recommend symptom control.  Patient was instructed regarding isolation and quarantine.  After history, exam, and medical workup I feel the patient has been appropriately medically screened and is safe for discharge home. Pertinent diagnoses were discussed with the patient. Patient was given return precautions.  Final Clinical Impression(s) / ED Diagnoses Final diagnoses:  Nausea vomiting and diarrhea  COVID  Dehydration    Rx / DC Orders ED Discharge Orders          Ordered    ondansetron (ZOFRAN ODT) 4 MG disintegrating tablet  Every 8 hours PRN        12/12/20 0406             Merryl Hacker, MD 12/12/20 [phone]

## 2020-12-24 ENCOUNTER — Ambulatory Visit (INDEPENDENT_AMBULATORY_CARE_PROVIDER_SITE_OTHER): Payer: BC Managed Care – PPO | Admitting: Primary Care

## 2020-12-28 ENCOUNTER — Other Ambulatory Visit: Payer: Self-pay

## 2020-12-28 ENCOUNTER — Ambulatory Visit (INDEPENDENT_AMBULATORY_CARE_PROVIDER_SITE_OTHER): Payer: BC Managed Care – PPO | Admitting: Nurse Practitioner

## 2020-12-28 ENCOUNTER — Encounter (INDEPENDENT_AMBULATORY_CARE_PROVIDER_SITE_OTHER): Payer: Self-pay | Admitting: Nurse Practitioner

## 2020-12-28 VITALS — BP 127/82 | HR 87 | Temp 97.5°F | Ht 61.0 in | Wt 146.0 lb

## 2020-12-28 DIAGNOSIS — I10 Essential (primary) hypertension: Secondary | ICD-10-CM | POA: Diagnosis not present

## 2020-12-28 NOTE — Patient Instructions (Addendum)
Hypertension:  Continue current medications  Stay active  Low sodium diet  Follow up:  Follow up in 3 months for CPE with fasting labs   Hypertension, Adult Hypertension is another name for high blood pressure. High blood pressure forces your heart to work harder to pump blood. This can cause problems overtime. There are two numbers in a blood pressure reading. There is a top number (systolic) over a bottom number (diastolic). It is best to have a blood pressure that is below 120/80. Healthy choicescan help lower your blood pressure, or you may need medicine to help lower it. What are the causes? The cause of this condition is not known. Some conditions may be related tohigh blood pressure. What increases the risk? Smoking. Having type 2 diabetes mellitus, high cholesterol, or both. Not getting enough exercise or physical activity. Being overweight. Having too much fat, sugar, calories, or salt (sodium) in your diet. Drinking too much alcohol. Having long-term (chronic) kidney disease. Having a family history of high blood pressure. Age. Risk increases with age. Race. You may be at higher risk if you are African American. Gender. Men are at higher risk than women before age 56. After age 72, women are at higher risk than men. Having obstructive sleep apnea. Stress. What are the signs or symptoms? High blood pressure may not cause symptoms. Very high blood pressure (hypertensive crisis) may cause: Headache. Feelings of worry or nervousness (anxiety). Shortness of breath. Nosebleed. A feeling of being sick to your stomach (nausea). Throwing up (vomiting). Changes in how you see. Very bad chest pain. Seizures. How is this treated? This condition is treated by making healthy lifestyle changes, such as: Eating healthy foods. Exercising more. Drinking less alcohol. Your health care provider may prescribe medicine if lifestyle changes are not enough to get your blood pressure  under control, and if: Your top number is above 130. Your bottom number is above 80. Your personal target blood pressure may vary. Follow these instructions at home: Eating and drinking  If told, follow the DASH eating plan. To follow this plan: Fill one half of your plate at each meal with fruits and vegetables. Fill one fourth of your plate at each meal with whole grains. Whole grains include whole-wheat pasta, brown rice, and whole-grain bread. Eat or drink low-fat dairy products, such as skim milk or low-fat yogurt. Fill one fourth of your plate at each meal with low-fat (lean) proteins. Low-fat proteins include fish, chicken without skin, eggs, beans, and tofu. Avoid fatty meat, cured and processed meat, or chicken with skin. Avoid pre-made or processed food. Eat less than 1,500 mg of salt each day. Do not drink alcohol if: Your doctor tells you not to drink. You are pregnant, may be pregnant, or are planning to become pregnant. If you drink alcohol: Limit how much you use to: 0-1 drink a day for women. 0-2 drinks a day for men. Be aware of how much alcohol is in your drink. In the [place] one drink equals one 12 oz bottle of beer (355 mL), one 5 oz glass of wine (148 mL), or one 1 oz glass of hard liquor (44 mL).  Lifestyle  Work with your doctor to stay at a healthy weight or to lose weight. Ask your doctor what the best weight is for you. Get at least 30 minutes of exercise most days of the week. This may include walking, swimming, or biking. Get at least 30 minutes of exercise that strengthens your muscles (resistance  exercise) at least 3 days a week. This may include lifting weights or doing Pilates. Do not use any products that contain nicotine or tobacco, such as cigarettes, e-cigarettes, and chewing tobacco. If you need help quitting, ask your doctor. Check your blood pressure at home as told by your doctor. Keep all follow-up visits as told by your doctor. This is  important.  Medicines Take over-the-counter and prescription medicines only as told by your doctor. Follow directions carefully. Do not skip doses of blood pressure medicine. The medicine does not work as well if you skip doses. Skipping doses also puts you at risk for problems. Ask your doctor about side effects or reactions to medicines that you should watch for. Contact a doctor if you: Think you are having a reaction to the medicine you are taking. Have headaches that keep coming back (recurring). Feel dizzy. Have swelling in your ankles. Have trouble with your vision. Get help right away if you: Get a very bad headache. Start to feel mixed up (confused). Feel weak or numb. Feel faint. Have very bad pain in your: Chest. Belly (abdomen). Throw up more than once. Have trouble breathing. Summary Hypertension is another name for high blood pressure. High blood pressure forces your heart to work harder to pump blood. For most people, a normal blood pressure is less than 120/80. Making healthy choices can help lower blood pressure. If your blood pressure does not get lower with healthy choices, you may need to take medicine. This information is not intended to replace advice given to you by your health care provider. Make sure you discuss any questions you have with your healthcare provider. Document Revised: 01/06/2018 Document Reviewed: 01/06/2018 Elsevier Patient Education  2022 [place] Your Hypertension Hypertension, also called high blood pressure, is when the force of the blood pressing against the walls of the arteries is too strong. Arteries are blood vessels that carry blood from your heart throughout your body. Hypertension forces the heart to work harder to pump blood and may cause the arteries tobecome narrow or stiff. Understanding blood pressure readings Your personal target blood pressure may vary depending on your medical conditions, your age, and  other factors. A blood pressure reading includes a higher number over a lower number. Ideally, your blood pressure should be below 120/80. You should know that: The first, or top, number is called the systolic pressure. It is a measure of the pressure in your arteries as your heart beats. The second, or bottom number, is called the diastolic pressure. It is a measure of the pressure in your arteries as the heart relaxes. Blood pressure is classified into four stages. Based on your blood pressure reading, your health care provider may use the following stages to determine what type of treatment you need, if any. Systolic pressure and diastolicpressure are measured in a unit called mmHg. Normal Systolic pressure: below 123456. Diastolic pressure: below 80. Elevated Systolic pressure: [id number]. Diastolic pressure: below 80. Hypertension stage 1 Systolic pressure: 0000000. Diastolic pressure: [id number]. Hypertension stage 2 Systolic pressure: [id number] or above. Diastolic pressure: 90 or above. How can this condition affect me? Managing your hypertension is an important responsibility. Over time, hypertension can damage the arteries and decrease blood flow to important parts of the body, including the brain, heart, and kidneys. Having untreated or uncontrolled hypertension can lead to: A heart attack. A stroke. A weakened blood vessel (aneurysm). Heart failure. Kidney damage. Eye damage. Metabolic syndrome. Memory and concentration problems. Vascular dementia.  What actions can I take to manage this condition? Hypertension can be managed by making lifestyle changes and possibly by taking medicines. Your health care provider will help you make a plan to bring yourblood pressure within a normal range. Nutrition  Eat a diet that is high in fiber and potassium, and low in salt (sodium), added sugar, and fat. An example eating plan is called the Dietary Approaches to Stop Hypertension (DASH) diet. To eat this  way: Eat plenty of fresh fruits and vegetables. Try to fill one-half of your plate at each meal with fruits and vegetables. Eat whole grains, such as whole-wheat pasta, brown rice, or whole-grain bread. Fill about one-fourth of your plate with whole grains. Eat low-fat dairy products. Avoid fatty cuts of meat, processed or cured meats, and poultry with skin. Fill about one-fourth of your plate with lean proteins such as fish, chicken without skin, beans, eggs, and tofu. Avoid pre-made and processed foods. These tend to be higher in sodium, added sugar, and fat. Reduce your daily sodium intake. Most people with hypertension should eat less than 1,500 mg of sodium a day.  Lifestyle  Work with your health care provider to maintain a healthy body weight or to lose weight. Ask what an ideal weight is for you. Get at least 30 minutes of exercise that causes your heart to beat faster (aerobic exercise) most days of the week. Activities may include walking, swimming, or biking. Include exercise to strengthen your muscles (resistance exercise), such as weight lifting, as part of your weekly exercise routine. Try to do these types of exercises for 30 minutes at least 3 days a week. Do not use any products that contain nicotine or tobacco, such as cigarettes, e-cigarettes, and chewing tobacco. If you need help quitting, ask your health care provider. Control any long-term (chronic) conditions you have, such as high cholesterol or diabetes. Identify your sources of stress and find ways to manage stress. This may include meditation, deep breathing, or making time for fun activities.  Alcohol use Do not drink alcohol if: Your health care provider tells you not to drink. You are pregnant, may be pregnant, or are planning to become pregnant. If you drink alcohol: Limit how much you use to: 0-1 drink a day for women. 0-2 drinks a day for men. Be aware of how much alcohol is in your drink. In the [place] one  drink equals one 12 oz bottle of beer (355 mL), one 5 oz glass of wine (148 mL), or one 1 oz glass of hard liquor (44 mL). Medicines Your health care provider may prescribe medicine if lifestyle changes are not enough to get your blood pressure under control and if: Your systolic blood pressure is 130 or higher. Your diastolic blood pressure is 80 or higher. Take medicines only as told by your health care provider. Follow the directions carefully. Blood pressure medicines must be taken as told by your health care provider. The medicine does not work as well when you skip doses. Skippingdoses also puts you at risk for problems. Monitoring Before you monitor your blood pressure: Do not smoke, drink caffeinated beverages, or exercise within 30 minutes before taking a measurement. Use the bathroom and empty your bladder (urinate). Sit quietly for at least 5 minutes before taking measurements. Monitor your blood pressure at home as told by your health care provider. To do this: Sit with your back straight and supported. Place your feet flat on the floor. Do not cross your legs.  Support your arm on a flat surface, such as a table. Make sure your upper arm is at heart level. Each time you measure, take two or three readings one minute apart and record the results. You may also need to have your blood pressure checked regularly by your healthcare provider. General information Talk with your health care provider about your diet, exercise habits, and other lifestyle factors that may be contributing to hypertension. Review all the medicines you take with your health care provider because there may be side effects or interactions. Keep all visits as told by your health care provider. Your health care provider can help you create and adjust your plan for managing your high blood pressure. Where to find more information National Heart, Lung, and Blood Institute: https://wilson-eaton.com/ American Heart  Association: www.heart.org Contact a health care provider if: You think you are having a reaction to medicines you have taken. You have repeated (recurrent) headaches. You feel dizzy. You have swelling in your ankles. You have trouble with your vision. Get help right away if: You develop a severe headache or confusion. You have unusual weakness or numbness, or you feel faint. You have severe pain in your chest or abdomen. You vomit repeatedly. You have trouble breathing. These symptoms may represent a serious problem that is an emergency. Do not wait to see if the symptoms will go away. Get medical help right away. Call your local emergency services (911 in the [place] Do not drive yourself to the hospital. Summary Hypertension is when the force of blood pumping through your arteries is too strong. If this condition is not controlled, it may put you at risk for serious complications. Your personal target blood pressure may vary depending on your medical conditions, your age, and other factors. For most people, a normal blood pressure is less than 120/80. Hypertension is managed by lifestyle changes, medicines, or both. Lifestyle changes to help manage hypertension include losing weight, eating a healthy, low-sodium diet, exercising more, stopping smoking, and limiting alcohol. This information is not intended to replace advice given to you by your health care provider. Make sure you discuss any questions you have with your healthcare provider. Document Revised: 06/03/2019 Document Reviewed: 03/29/2019 Elsevier Patient Education  [place].  [email or web address]">  DASH Eating Plan DASH stands for Dietary Approaches to Stop Hypertension. The DASH eating plan is a healthy eating plan that has been shown to: Reduce high blood pressure (hypertension). Reduce your risk for type 2 diabetes, heart disease, and stroke. Help with weight  loss. What are tips for following this plan? Reading food labels Check food labels for the amount of salt (sodium) per serving. Choose foods with less than 5 percent of the Daily Value of sodium. Generally, foods with less than 300 milligrams (mg) of sodium per serving fit into this eating plan. To find whole grains, look for the word "whole" as the first word in the ingredient list. Shopping Buy products labeled as "low-sodium" or "no salt added." Buy fresh foods. Avoid canned foods and pre-made or frozen meals. Cooking Avoid adding salt when cooking. Use salt-free seasonings or herbs instead of table salt or sea salt. Check with your health care provider or pharmacist before using salt substitutes. Do not fry foods. Cook foods using healthy methods such as baking, boiling, grilling, roasting, and broiling instead. Cook with heart-healthy oils, such as olive, canola, avocado, soybean, or sunflower oil. Meal planning  Eat a balanced diet that includes: 4 or more servings of fruits  and 4 or more servings of vegetables each day. Try to fill one-half of your plate with fruits and vegetables. 6-8 servings of whole grains each day. Less than 6 oz (170 g) of lean meat, poultry, or fish each day. A 3-oz (85-g) serving of meat is about the same size as a deck of cards. One egg equals 1 oz (28 g). 2-3 servings of low-fat dairy each day. One serving is 1 cup (237 mL). 1 serving of nuts, seeds, or beans 5 times each week. 2-3 servings of heart-healthy fats. Healthy fats called omega-3 fatty acids are found in foods such as walnuts, flaxseeds, fortified milks, and eggs. These fats are also found in cold-water fish, such as sardines, salmon, and mackerel. Limit how much you eat of: Canned or prepackaged foods. Food that is high in trans fat, such as some fried foods. Food that is high in saturated fat, such as fatty meat. Desserts and other sweets, sugary drinks, and other foods with added  sugar. Full-fat dairy products. Do not salt foods before eating. Do not eat more than 4 egg yolks a week. Try to eat at least 2 vegetarian meals a week. Eat more home-cooked food and less restaurant, buffet, and fast food.  Lifestyle When eating at a restaurant, ask that your food be prepared with less salt or no salt, if possible. If you drink alcohol: Limit how much you use to: 0-1 drink a day for women who are not pregnant. 0-2 drinks a day for men. Be aware of how much alcohol is in your drink. In the [place] one drink equals one 12 oz bottle of beer (355 mL), one 5 oz glass of wine (148 mL), or one 1 oz glass of hard liquor (44 mL). General information Avoid eating more than 2,300 mg of salt a day. If you have hypertension, you may need to reduce your sodium intake to 1,500 mg a day. Work with your health care provider to maintain a healthy body weight or to lose weight. Ask what an ideal weight is for you. Get at least 30 minutes of exercise that causes your heart to beat faster (aerobic exercise) most days of the week. Activities may include walking, swimming, or biking. Work with your health care provider or dietitian to adjust your eating plan to your individual calorie needs. What foods should I eat? Fruits All fresh, dried, or frozen fruit. Canned fruit in natural juice (without addedsugar). Vegetables Fresh or frozen vegetables (raw, steamed, roasted, or grilled). Low-sodium or reduced-sodium tomato and vegetable juice. Low-sodium or reduced-sodium tomatosauce and tomato paste. Low-sodium or reduced-sodium canned vegetables. Grains Whole-grain or whole-wheat bread. Whole-grain or whole-wheat pasta. Brown rice. [place]. Bulgur. Whole-grain and low-sodium cereals. Pita bread.Low-fat, low-sodium crackers. Whole-wheat flour tortillas. Meats and other proteins Skinless chicken or [place]. Ground chicken or [place]. Pork with fat trimmed off. Fish and seafood. Egg whites. Dried  beans, peas, or lentils. Unsalted nuts, nut butters, and seeds. Unsalted canned beans. Lean cuts of beef with fat trimmed off. Low-sodium, lean precooked or cured meat, such as sausages or meatloaves. Dairy Low-fat (1%) or fat-free (skim) milk. Reduced-fat, low-fat, or fat-free cheeses. Nonfat, low-sodium ricotta or cottage cheese. Low-fat or nonfatyogurt. Low-fat, low-sodium cheese. Fats and oils Soft margarine without trans fats. Vegetable oil. Reduced-fat, low-fat, or light mayonnaise and salad dressings (reduced-sodium). Canola, safflower, olive, avocado, soybean, andsunflower oils. Avocado. Seasonings and condiments Herbs. Spices. Seasoning mixes without salt. Other foods Unsalted popcorn and pretzels. Fat-free sweets. The items listed  above may not be a complete list of foods and beverages you can eat. Contact a dietitian for more information. What foods should I avoid? Fruits Canned fruit in a light or heavy syrup. Fried fruit. Fruit in cream or buttersauce. Vegetables Creamed or fried vegetables. Vegetables in a cheese sauce. Regular canned vegetables (not low-sodium or reduced-sodium). Regular canned tomato sauce and paste (not low-sodium or reduced-sodium). Regular tomato and vegetable juice(not low-sodium or reduced-sodium). Angie Fava. Olives. Grains Baked goods made with fat, such as croissants, muffins, or some breads. Drypasta or rice meal packs. Meats and other proteins Fatty cuts of meat. Ribs. Fried meat. Berniece Salines. Bologna, salami, and other precooked or cured meats, such as sausages or meat loaves. Fat from the back of a pig (fatback). Bratwurst. Salted nuts and seeds. Canned beans with added salt. Canned orsmoked fish. Whole eggs or egg yolks. Chicken or [place] with skin. Dairy Whole or 2% milk, cream, and half-and-half. Whole or full-fat cream cheese. Whole-fat or sweetened yogurt. Full-fat cheese. Nondairy creamers. Whippedtoppings. Processed cheese and cheese spreads. Fats and  oils Butter. Stick margarine. Lard. Shortening. Ghee. Bacon fat. Tropical oils, suchas coconut, palm kernel, or palm oil. Seasonings and condiments Onion salt, garlic salt, seasoned salt, table salt, and sea salt. Worcestershire sauce. Tartar sauce. Barbecue sauce. Teriyaki sauce. Soy sauce, including reduced-sodium. Steak sauce. Canned and packaged gravies. Fish sauce. Oyster sauce. Cocktail sauce. Store-bought horseradish. Ketchup. Mustard. Meat flavorings and tenderizers. Bouillon cubes. Hot sauces. Pre-made or packaged marinades. Pre-made or packaged taco seasonings. Relishes. Regular saladdressings. Other foods Salted popcorn and pretzels. The items listed above may not be a complete list of foods and beverages you should avoid. Contact a dietitian for more information. Where to find more information National Heart, Lung, and Blood Institute: https://wilson-eaton.com/ American Heart Association: www.heart.org Academy of Nutrition and Dietetics: www.eatright.[place]: www.kidney.org Summary The DASH eating plan is a healthy eating plan that has been shown to reduce high blood pressure (hypertension). It may also reduce your risk for type 2 diabetes, heart disease, and stroke. When on the DASH eating plan, aim to eat more fresh fruits and vegetables, whole grains, lean proteins, low-fat dairy, and heart-healthy fats. With the DASH eating plan, you should limit salt (sodium) intake to 2,300 mg a day. If you have hypertension, you may need to reduce your sodium intake to 1,500 mg a day. Work with your health care provider or dietitian to adjust your eating plan to your individual calorie needs. This information is not intended to replace advice given to you by your health care provider. Make sure you discuss any questions you have with your healthcare provider. Document Revised: 04/01/2019 Document Reviewed: 04/01/2019 Elsevier Patient Education  2022 [employer].

## 2020-12-28 NOTE — Progress Notes (Signed)
Subjective:    Patient here for follow-up of elevated blood pressure.  She  ia very active at work but not currently  exercising and is adherent to a low-salt diet.  Blood pressure is well controlled at home. Cardiac symptoms: none. Patient denies: chest pain, claudication, irregular heart beat, lower extremity edema, palpitations, and tachypnea. Cardiovascular risk factors: hypertension and smoking/ tobacco exposure. Use of agents associated with hypertension: none. History of target organ damage: none.    Review of Systems  Review of Systems  Constitutional: Negative.   HENT: Negative.    Eyes: Negative.   Respiratory: Negative.    Cardiovascular: Negative.   Gastrointestinal: Negative.   Genitourinary: Negative.   Skin: Negative.   Neurological: Negative.   Endo/Heme/Allergies: Negative.   Psychiatric/Behavioral: Negative.        Objective:   Vitals with BMI 12/28/2020 12/12/2020 12/12/2020  Height '5\' 1"'$  - -  Weight 146 lbs - -  BMI 0000000 - -  Systolic [id number]  Diastolic 82 72 78  Pulse 87 80 82   Physical Exam Constitutional:      General: She is not in acute distress. Cardiovascular:     Rate and Rhythm: Normal rate and regular rhythm.  Pulmonary:     Effort: Pulmonary effort is normal.     Breath sounds: Normal breath sounds.  Skin:    General: Skin is warm and dry.  Neurological:     Mental Status: She is alert and oriented to person, place, and time.  Psychiatric:        Mood and Affect: Affect normal.         Assessment:    Hypertension, normal blood pressure today. Evidence of target organ damage: none.    Plan:    Dietary sodium restriction. Regular aerobic exercise. Check blood pressures weekly and record. Follow up: 3 months and as needed.   Patient Instructions  Hypertension:  Continue current medications  Stay active  Low sodium diet  Follow up:  Follow up in 3 months for CPE with fasting labs   Hypertension,  Adult Hypertension is another name for high blood pressure. High blood pressure forces your heart to work harder to pump blood. This can cause problems overtime. There are two numbers in a blood pressure reading. There is a top number (systolic) over a bottom number (diastolic). It is best to have a blood pressure that is below 120/80. Healthy choicescan help lower your blood pressure, or you may need medicine to help lower it. What are the causes? The cause of this condition is not known. Some conditions may be related tohigh blood pressure. What increases the risk? Smoking. Having type 2 diabetes mellitus, high cholesterol, or both. Not getting enough exercise or physical activity. Being overweight. Having too much fat, sugar, calories, or salt (sodium) in your diet. Drinking too much alcohol. Having long-term (chronic) kidney disease. Having a family history of high blood pressure. Age. Risk increases with age. Race. You may be at higher risk if you are African American. Gender. Men are at higher risk than women before age 74. After age 38, women are at higher risk than men. Having obstructive sleep apnea. Stress. What are the signs or symptoms? High blood pressure may not cause symptoms. Very high blood pressure (hypertensive crisis) may cause: Headache. Feelings of worry or nervousness (anxiety). Shortness of breath. Nosebleed. A feeling of being sick to your stomach (nausea). Throwing up (vomiting). Changes in how you see. Very bad chest pain.  Seizures. How is this treated? This condition is treated by making healthy lifestyle changes, such as: Eating healthy foods. Exercising more. Drinking less alcohol. Your health care provider may prescribe medicine if lifestyle changes are not enough to get your blood pressure under control, and if: Your top number is above 130. Your bottom number is above 80. Your personal target blood pressure may vary. Follow these instructions at  home: Eating and drinking  If told, follow the DASH eating plan. To follow this plan: Fill one half of your plate at each meal with fruits and vegetables. Fill one fourth of your plate at each meal with whole grains. Whole grains include whole-wheat pasta, brown rice, and whole-grain bread. Eat or drink low-fat dairy products, such as skim milk or low-fat yogurt. Fill one fourth of your plate at each meal with low-fat (lean) proteins. Low-fat proteins include fish, chicken without skin, eggs, beans, and tofu. Avoid fatty meat, cured and processed meat, or chicken with skin. Avoid pre-made or processed food. Eat less than 1,500 mg of salt each day. Do not drink alcohol if: Your doctor tells you not to drink. You are pregnant, may be pregnant, or are planning to become pregnant. If you drink alcohol: Limit how much you use to: 0-1 drink a day for women. 0-2 drinks a day for men. Be aware of how much alcohol is in your drink. In the [place] one drink equals one 12 oz bottle of beer (355 mL), one 5 oz glass of wine (148 mL), or one 1 oz glass of hard liquor (44 mL).  Lifestyle  Work with your doctor to stay at a healthy weight or to lose weight. Ask your doctor what the best weight is for you. Get at least 30 minutes of exercise most days of the week. This may include walking, swimming, or biking. Get at least 30 minutes of exercise that strengthens your muscles (resistance exercise) at least 3 days a week. This may include lifting weights or doing Pilates. Do not use any products that contain nicotine or tobacco, such as cigarettes, e-cigarettes, and chewing tobacco. If you need help quitting, ask your doctor. Check your blood pressure at home as told by your doctor. Keep all follow-up visits as told by your doctor. This is important.  Medicines Take over-the-counter and prescription medicines only as told by your doctor. Follow directions carefully. Do not skip doses of blood pressure  medicine. The medicine does not work as well if you skip doses. Skipping doses also puts you at risk for problems. Ask your doctor about side effects or reactions to medicines that you should watch for. Contact a doctor if you: Think you are having a reaction to the medicine you are taking. Have headaches that keep coming back (recurring). Feel dizzy. Have swelling in your ankles. Have trouble with your vision. Get help right away if you: Get a very bad headache. Start to feel mixed up (confused). Feel weak or numb. Feel faint. Have very bad pain in your: Chest. Belly (abdomen). Throw up more than once. Have trouble breathing. Summary Hypertension is another name for high blood pressure. High blood pressure forces your heart to work harder to pump blood. For most people, a normal blood pressure is less than 120/80. Making healthy choices can help lower blood pressure. If your blood pressure does not get lower with healthy choices, you may need to take medicine. This information is not intended to replace advice given to you by your health care  provider. Make sure you discuss any questions you have with your healthcare provider. Document Revised: 01/06/2018 Document Reviewed: 01/06/2018 Elsevier Patient Education  2022 [place] Your Hypertension Hypertension, also called high blood pressure, is when the force of the blood pressing against the walls of the arteries is too strong. Arteries are blood vessels that carry blood from your heart throughout your body. Hypertension forces the heart to work harder to pump blood and may cause the arteries tobecome narrow or stiff. Understanding blood pressure readings Your personal target blood pressure may vary depending on your medical conditions, your age, and other factors. A blood pressure reading includes a higher number over a lower number. Ideally, your blood pressure should be below 120/80. You should know that: The first,  or top, number is called the systolic pressure. It is a measure of the pressure in your arteries as your heart beats. The second, or bottom number, is called the diastolic pressure. It is a measure of the pressure in your arteries as the heart relaxes. Blood pressure is classified into four stages. Based on your blood pressure reading, your health care provider may use the following stages to determine what type of treatment you need, if any. Systolic pressure and diastolicpressure are measured in a unit called mmHg. Normal Systolic pressure: below 123456. Diastolic pressure: below 80. Elevated Systolic pressure: [id number]. Diastolic pressure: below 80. Hypertension stage 1 Systolic pressure: 0000000. Diastolic pressure: [id number]. Hypertension stage 2 Systolic pressure: [id number] or above. Diastolic pressure: 90 or above. How can this condition affect me? Managing your hypertension is an important responsibility. Over time, hypertension can damage the arteries and decrease blood flow to important parts of the body, including the brain, heart, and kidneys. Having untreated or uncontrolled hypertension can lead to: A heart attack. A stroke. A weakened blood vessel (aneurysm). Heart failure. Kidney damage. Eye damage. Metabolic syndrome. Memory and concentration problems. Vascular dementia. What actions can I take to manage this condition? Hypertension can be managed by making lifestyle changes and possibly by taking medicines. Your health care provider will help you make a plan to bring yourblood pressure within a normal range. Nutrition  Eat a diet that is high in fiber and potassium, and low in salt (sodium), added sugar, and fat. An example eating plan is called the Dietary Approaches to Stop Hypertension (DASH) diet. To eat this way: Eat plenty of fresh fruits and vegetables. Try to fill one-half of your plate at each meal with fruits and vegetables. Eat whole grains, such as whole-wheat pasta,  brown rice, or whole-grain bread. Fill about one-fourth of your plate with whole grains. Eat low-fat dairy products. Avoid fatty cuts of meat, processed or cured meats, and poultry with skin. Fill about one-fourth of your plate with lean proteins such as fish, chicken without skin, beans, eggs, and tofu. Avoid pre-made and processed foods. These tend to be higher in sodium, added sugar, and fat. Reduce your daily sodium intake. Most people with hypertension should eat less than 1,500 mg of sodium a day.  Lifestyle  Work with your health care provider to maintain a healthy body weight or to lose weight. Ask what an ideal weight is for you. Get at least 30 minutes of exercise that causes your heart to beat faster (aerobic exercise) most days of the week. Activities may include walking, swimming, or biking. Include exercise to strengthen your muscles (resistance exercise), such as weight lifting, as part of your weekly exercise routine. Try to do these  types of exercises for 30 minutes at least 3 days a week. Do not use any products that contain nicotine or tobacco, such as cigarettes, e-cigarettes, and chewing tobacco. If you need help quitting, ask your health care provider. Control any long-term (chronic) conditions you have, such as high cholesterol or diabetes. Identify your sources of stress and find ways to manage stress. This may include meditation, deep breathing, or making time for fun activities.  Alcohol use Do not drink alcohol if: Your health care provider tells you not to drink. You are pregnant, may be pregnant, or are planning to become pregnant. If you drink alcohol: Limit how much you use to: 0-1 drink a day for women. 0-2 drinks a day for men. Be aware of how much alcohol is in your drink. In the [place] one drink equals one 12 oz bottle of beer (355 mL), one 5 oz glass of wine (148 mL), or one 1 oz glass of hard liquor (44 mL). Medicines Your health care provider may  prescribe medicine if lifestyle changes are not enough to get your blood pressure under control and if: Your systolic blood pressure is 130 or higher. Your diastolic blood pressure is 80 or higher. Take medicines only as told by your health care provider. Follow the directions carefully. Blood pressure medicines must be taken as told by your health care provider. The medicine does not work as well when you skip doses. Skippingdoses also puts you at risk for problems. Monitoring Before you monitor your blood pressure: Do not smoke, drink caffeinated beverages, or exercise within 30 minutes before taking a measurement. Use the bathroom and empty your bladder (urinate). Sit quietly for at least 5 minutes before taking measurements. Monitor your blood pressure at home as told by your health care provider. To do this: Sit with your back straight and supported. Place your feet flat on the floor. Do not cross your legs. Support your arm on a flat surface, such as a table. Make sure your upper arm is at heart level. Each time you measure, take two or three readings one minute apart and record the results. You may also need to have your blood pressure checked regularly by your healthcare provider. General information Talk with your health care provider about your diet, exercise habits, and other lifestyle factors that may be contributing to hypertension. Review all the medicines you take with your health care provider because there may be side effects or interactions. Keep all visits as told by your health care provider. Your health care provider can help you create and adjust your plan for managing your high blood pressure. Where to find more information National Heart, Lung, and Blood Institute: https://wilson-eaton.com/ American Heart Association: www.heart.org Contact a health care provider if: You think you are having a reaction to medicines you have taken. You have repeated (recurrent) headaches. You  feel dizzy. You have swelling in your ankles. You have trouble with your vision. Get help right away if: You develop a severe headache or confusion. You have unusual weakness or numbness, or you feel faint. You have severe pain in your chest or abdomen. You vomit repeatedly. You have trouble breathing. These symptoms may represent a serious problem that is an emergency. Do not wait to see if the symptoms will go away. Get medical help right away. Call your local emergency services (911 in the [place] Do not drive yourself to the hospital. Summary Hypertension is when the force of blood pumping through your arteries is too  strong. If this condition is not controlled, it may put you at risk for serious complications. Your personal target blood pressure may vary depending on your medical conditions, your age, and other factors. For most people, a normal blood pressure is less than 120/80. Hypertension is managed by lifestyle changes, medicines, or both. Lifestyle changes to help manage hypertension include losing weight, eating a healthy, low-sodium diet, exercising more, stopping smoking, and limiting alcohol. This information is not intended to replace advice given to you by your health care provider. Make sure you discuss any questions you have with your healthcare provider. Document Revised: 06/03/2019 Document Reviewed: 03/29/2019 Elsevier Patient Education  [email or web address]">  DASH Eating Plan DASH stands for Dietary Approaches to Stop Hypertension. The DASH eating plan is a healthy eating plan that has been shown to: Reduce high blood pressure (hypertension). Reduce your risk for type 2 diabetes, heart disease, and stroke. Help with weight loss. What are tips for following this plan? Reading food labels Check food labels for the amount of salt (sodium) per serving. Choose foods with less than 5 percent of the Daily  Value of sodium. Generally, foods with less than 300 milligrams (mg) of sodium per serving fit into this eating plan. To find whole grains, look for the word "whole" as the first word in the ingredient list. Shopping Buy products labeled as "low-sodium" or "no salt added." Buy fresh foods. Avoid canned foods and pre-made or frozen meals. Cooking Avoid adding salt when cooking. Use salt-free seasonings or herbs instead of table salt or sea salt. Check with your health care provider or pharmacist before using salt substitutes. Do not fry foods. Cook foods using healthy methods such as baking, boiling, grilling, roasting, and broiling instead. Cook with heart-healthy oils, such as olive, canola, avocado, soybean, or sunflower oil. Meal planning  Eat a balanced diet that includes: 4 or more servings of fruits and 4 or more servings of vegetables each day. Try to fill one-half of your plate with fruits and vegetables. 6-8 servings of whole grains each day. Less than 6 oz (170 g) of lean meat, poultry, or fish each day. A 3-oz (85-g) serving of meat is about the same size as a deck of cards. One egg equals 1 oz (28 g). 2-3 servings of low-fat dairy each day. One serving is 1 cup (237 mL). 1 serving of nuts, seeds, or beans 5 times each week. 2-3 servings of heart-healthy fats. Healthy fats called omega-3 fatty acids are found in foods such as walnuts, flaxseeds, fortified milks, and eggs. These fats are also found in cold-water fish, such as sardines, salmon, and mackerel. Limit how much you eat of: Canned or prepackaged foods. Food that is high in trans fat, such as some fried foods. Food that is high in saturated fat, such as fatty meat. Desserts and other sweets, sugary drinks, and other foods with added sugar. Full-fat dairy products. Do not salt foods before eating. Do not eat more than 4 egg yolks a week. Try to eat at least 2 vegetarian meals a week. Eat more home-cooked food and less  restaurant, buffet, and fast food.  Lifestyle When eating at a restaurant, ask that your food be prepared with less salt or no salt, if possible. If you drink alcohol: Limit how much you use to: 0-1 drink a day for women who are not pregnant. 0-2 drinks a day for men. Be aware of how much alcohol is in your drink. In the  [place] one drink equals one 12 oz bottle of beer (355 mL), one 5 oz glass of wine (148 mL), or one 1 oz glass of hard liquor (44 mL). General information Avoid eating more than 2,300 mg of salt a day. If you have hypertension, you may need to reduce your sodium intake to 1,500 mg a day. Work with your health care provider to maintain a healthy body weight or to lose weight. Ask what an ideal weight is for you. Get at least 30 minutes of exercise that causes your heart to beat faster (aerobic exercise) most days of the week. Activities may include walking, swimming, or biking. Work with your health care provider or dietitian to adjust your eating plan to your individual calorie needs. What foods should I eat? Fruits All fresh, dried, or frozen fruit. Canned fruit in natural juice (without addedsugar). Vegetables Fresh or frozen vegetables (raw, steamed, roasted, or grilled). Low-sodium or reduced-sodium tomato and vegetable juice. Low-sodium or reduced-sodium tomatosauce and tomato paste. Low-sodium or reduced-sodium canned vegetables. Grains Whole-grain or whole-wheat bread. Whole-grain or whole-wheat pasta. Brown rice. [place]. Bulgur. Whole-grain and low-sodium cereals. Pita bread.Low-fat, low-sodium crackers. Whole-wheat flour tortillas. Meats and other proteins Skinless chicken or [place]. Ground chicken or [place]. Pork with fat trimmed off. Fish and seafood. Egg whites. Dried beans, peas, or lentils. Unsalted nuts, nut butters, and seeds. Unsalted canned beans. Lean cuts of beef with fat trimmed off. Low-sodium, lean precooked or cured meat, such as sausages or  meatloaves. Dairy Low-fat (1%) or fat-free (skim) milk. Reduced-fat, low-fat, or fat-free cheeses. Nonfat, low-sodium ricotta or cottage cheese. Low-fat or nonfatyogurt. Low-fat, low-sodium cheese. Fats and oils Soft margarine without trans fats. Vegetable oil. Reduced-fat, low-fat, or light mayonnaise and salad dressings (reduced-sodium). Canola, safflower, olive, avocado, soybean, andsunflower oils. Avocado. Seasonings and condiments Herbs. Spices. Seasoning mixes without salt. Other foods Unsalted popcorn and pretzels. Fat-free sweets. The items listed above may not be a complete list of foods and beverages you can eat. Contact a dietitian for more information. What foods should I avoid? Fruits Canned fruit in a light or heavy syrup. Fried fruit. Fruit in cream or buttersauce. Vegetables Creamed or fried vegetables. Vegetables in a cheese sauce. Regular canned vegetables (not low-sodium or reduced-sodium). Regular canned tomato sauce and paste (not low-sodium or reduced-sodium). Regular tomato and vegetable juice(not low-sodium or reduced-sodium). Angie Fava. Olives. Grains Baked goods made with fat, such as croissants, muffins, or some breads. Drypasta or rice meal packs. Meats and other proteins Fatty cuts of meat. Ribs. Fried meat. Berniece Salines. Bologna, salami, and other precooked or cured meats, such as sausages or meat loaves. Fat from the back of a pig (fatback). Bratwurst. Salted nuts and seeds. Canned beans with added salt. Canned orsmoked fish. Whole eggs or egg yolks. Chicken or [place] with skin. Dairy Whole or 2% milk, cream, and half-and-half. Whole or full-fat cream cheese. Whole-fat or sweetened yogurt. Full-fat cheese. Nondairy creamers. Whippedtoppings. Processed cheese and cheese spreads. Fats and oils Butter. Stick margarine. Lard. Shortening. Ghee. Bacon fat. Tropical oils, suchas coconut, palm kernel, or palm oil. Seasonings and condiments Onion salt, garlic salt, seasoned salt,  table salt, and sea salt. Worcestershire sauce. Tartar sauce. Barbecue sauce. Teriyaki sauce. Soy sauce, including reduced-sodium. Steak sauce. Canned and packaged gravies. Fish sauce. Oyster sauce. Cocktail sauce. Store-bought horseradish. Ketchup. Mustard. Meat flavorings and tenderizers. Bouillon cubes. Hot sauces. Pre-made or packaged marinades. Pre-made or packaged taco seasonings. Relishes. Regular saladdressings. Other foods Salted popcorn and pretzels. The items  listed above may not be a complete list of foods and beverages you should avoid. Contact a dietitian for more information. Where to find more information National Heart, Lung, and Blood Institute: https://wilson-eaton.com/ American Heart Association: www.heart.org Academy of Nutrition and Dietetics: www.eatright.[place]: www.kidney.org Summary The DASH eating plan is a healthy eating plan that has been shown to reduce high blood pressure (hypertension). It may also reduce your risk for type 2 diabetes, heart disease, and stroke. When on the DASH eating plan, aim to eat more fresh fruits and vegetables, whole grains, lean proteins, low-fat dairy, and heart-healthy fats. With the DASH eating plan, you should limit salt (sodium) intake to 2,300 mg a day. If you have hypertension, you may need to reduce your sodium intake to 1,500 mg a day. Work with your health care provider or dietitian to adjust your eating plan to your individual calorie needs. This information is not intended to replace advice given to you by your health care provider. Make sure you discuss any questions you have with your healthcare provider. Document Revised: 04/01/2019 Document Reviewed: 04/01/2019 Elsevier Patient Education  2022 [place]. Nils Pyle, FNP-C  12/28/20
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
## 2021-04-01 ENCOUNTER — Encounter (INDEPENDENT_AMBULATORY_CARE_PROVIDER_SITE_OTHER): Payer: BC Managed Care – PPO | Admitting: Primary Care

## 2021-07-17 ENCOUNTER — Other Ambulatory Visit (INDEPENDENT_AMBULATORY_CARE_PROVIDER_SITE_OTHER): Payer: Self-pay | Admitting: Primary Care

## 2021-07-17 MED ORDER — LORATADINE 10 MG PO TABS: 10.0000 mg | ORAL_TABLET | Freq: Every day | ORAL | 3 refills | Status: DC

## 2021-07-17 MED ORDER — AMLODIPINE BESYLATE 10 MG PO TABS: 10.0000 mg | ORAL_TABLET | Freq: Every day | ORAL | 0 refills | Status: DC

## 2021-07-17 MED ORDER — LISINOPRIL-HYDROCHLOROTHIAZIDE 20-25 MG PO TABS: 1.0000 | ORAL_TABLET | Freq: Every day | ORAL | 0 refills | Status: DC

## 2021-07-17 MED ORDER — ALBUTEROL SULFATE HFA 108 (90 BASE) MCG/ACT IN AERS: 2.0000 | INHALATION_SPRAY | Freq: Four times a day (QID) | RESPIRATORY_TRACT | 2 refills | Status: DC | PRN

## 2021-07-17 NOTE — Telephone Encounter (Signed)
Medication Refill - Medication: amLODipine (NORVASC) 10 MG tablet ?lisinopril-hydrochlorothiazide (ZESTORETIC) 20-25 MG tablet ?loratadine (CLARITIN) 10 MG tablet ?albuterol (VENTOLIN HFA) 108 (90 Base) MCG/ACT inhaler ?Nebulizer solution for her machine ?Has the patient contacted their pharmacy? No. ? ?Preferred Pharmacy (with phone number or street name): [place] ([place]), [place] - [address] ?Has the patient been seen for an appointment in the last year OR does the patient have an upcoming appointment? Yes.   ? ?Agent: Please be advised that RX refills may take up to 3 business days. We ask that you follow-up with your pharmacy. ? ?

## 2021-07-17 NOTE — Telephone Encounter (Signed)
Requested Prescriptions  ?Pending Prescriptions Disp Refills  ?? albuterol (VENTOLIN HFA) 108 (90 Base) MCG/ACT inhaler 6.7 g 2  ?  Sig: Inhale 2 puffs into the lungs every 6 (six) hours as needed for wheezing or shortness of breath.  ?  ? Pulmonology:  Beta Agonists 2 Passed - 07/17/2021 11:20 AM  ?  ?  Passed - Last BP in normal range  ?  BP Readings from Last 1 Encounters:  ?12/28/20 127/82  ?   ?  ?  Passed - Last Heart Rate in normal range  ?  Pulse Readings from Last 1 Encounters:  ?12/28/20 87  ?   ?  ?  Passed - Valid encounter within last 12 months  ?  Recent Outpatient Visits   ?      ? 6 months ago Hypertension, unspecified type  ? [facility] Fenton Foy, NP  ? 9 months ago Essential hypertension  ? [facility] Kerin Perna, NP  ? 11 months ago Vaginal discharge  ? [facility] Kerin Perna, NP  ? 1 year ago Vaginal odor  ? [facility] Kerin Perna, NP  ? 2 years ago Vaginal discharge  ? [facility] Kerin Perna, NP  ?  ?  ?Future Appointments   ?        ? In 5 days Kerin Perna, NP [facility]  ?  ? ?  ?  ?  ?? amLODipine (NORVASC) 10 MG tablet 90 tablet 0  ?  Sig: Take 1 tablet (10 mg total) by mouth daily.  ?  ? Cardiovascular: Calcium Channel Blockers 2 Failed - 07/17/2021 11:20 AM  ?  ?  Failed - Valid encounter within last 6 months  ?  Recent Outpatient Visits   ?      ? 6 months ago Hypertension, unspecified type  ? [facility] Fenton Foy, NP  ? 9 months ago Essential hypertension  ? [facility] Kerin Perna, NP  ? 11 months ago Vaginal discharge  ? [facility] Kerin Perna, NP  ? 1 year ago Vaginal odor  ? [facility] Kerin Perna, NP  ? 2 years ago Vaginal discharge  ? [facility]  Kerin Perna, NP  ?  ?  ?Future Appointments   ?        ? In 5 days Kerin Perna, NP [place]  ?  ? ?  ?  ?  Passed - Last BP in normal range  ?  BP Readings from Last 1 Encounters:  ?12/28/20 127/82  ?   ?  ?  Passed - Last Heart Rate in normal range  ?  Pulse Readings from Last 1 Encounters:  ?12/28/20 87  ?   ?  ?  ?? lisinopril-hydrochlorothiazide (ZESTORETIC) 20-25 MG tablet 90 tablet 0  ?  Sig: Take 1 tablet by mouth daily.  ?  ? Cardiovascular:  ACEI + Diuretic Combos Failed - 07/17/2021 11:20 AM  ?  ?  Failed - Na in normal range and within 180 days  ?  Sodium  ?Date Value Ref Range Status  ?12/11/2020 138 135 - 145 mmol/L Final  ?05/18/2019 139 134 - 144 mmol/L Final  ?   ?  ?  Failed - K in normal range and within 180 days  ?  Potassium  ?Date Value Ref Range Status  ?12/11/2020 3.6 3.5 - 5.1 mmol/L Final  ?   ?  ?  Failed - Cr in normal range and within 180 days  ?  Creatinine, Ser  ?Date Value Ref Range Status  ?12/11/2020 1.71 (H) 0.44 - 1.00 mg/dL Final  ?   ?  ?  Failed - eGFR is 30 or above and within 180 days  ?  GFR calc Af Amer  ?Date Value Ref Range Status  ?05/18/2019 119 >59 mL/min/1.73 Final  ? ?GFR, Estimated  ?Date Value Ref Range Status  ?12/11/2020 35 (L) >60 mL/min Final  ?  Comment:  ?  (NOTE) ?Calculated using the CKD-EPI Creatinine Equation (2021) ?  ? ?GFR  ?Date Value Ref Range Status  ?04/17/2020 76.45 >60.00 mL/min Final  ?  Comment:  ?  Calculated using the CKD-EPI Creatinine Equation (2021)  ?   ?  ?  Failed - Valid encounter within last 6 months  ?  Recent Outpatient Visits   ?      ? 6 months ago Hypertension, unspecified type  ? [facility] Fenton Foy, NP  ? 9 months ago Essential hypertension  ? [facility] Kerin Perna, NP  ? 11 months ago Vaginal discharge  ? [facility] Kerin Perna, NP  ? 1 year ago Vaginal odor  ? [facility] Kerin Perna, NP  ? 2 years ago Vaginal discharge  ? [facility] Kerin Perna, NP  ?  ?  ?Future Appointments   ?        ? In 5 days Kerin Perna, NP [place]  ?  ? ?  ?  ?  Passed - Patient is not pregnant  ?  ?  Passed - Last BP in normal range  ?  BP Readings from Last 1 Encounters:  ?12/28/20 127/82  ?   ?  ?  ?? loratadine (CLARITIN) 10 MG tablet 90 tablet 3  ?  Sig: Take 1 tablet (10 mg total) by mouth at bedtime.  ?  ? Ear, Nose, and Throat:  Antihistamines 2 Failed - 07/17/2021 11:20 AM  ?  ?  Failed - Cr in normal range and within 360 days  ?  Creatinine, Ser  ?Date Value Ref Range Status  ?12/11/2020 1.71 (H) 0.44 - 1.00 mg/dL Final  ?   ?  ?  Passed - Valid encounter within last 12 months  ?  Recent Outpatient Visits   ?      ? 6 months ago Hypertension, unspecified type  ? [facility] Fenton Foy, NP  ? 9 months ago Essential hypertension  ? [facility] Kerin Perna, NP  ? 11 months ago Vaginal discharge  ? [facility] Kerin Perna, NP  ? 1 year ago Vaginal odor  ? [facility] Kerin Perna, NP  ? 2 years ago Vaginal discharge  ? [facility] Kerin Perna, NP  ?  ?  ?Future Appointments   ?        ? In 5 days Kerin Perna, NP [place]  ?  ? ?  ?  ?  ? ?

## 2021-07-17 NOTE — Telephone Encounter (Signed)
Patient requesting nebulizer solution for her machine. Will need new Rx as no solution is listed on her current/past medication list. ?

## 2021-07-22 ENCOUNTER — Other Ambulatory Visit: Payer: Self-pay

## 2021-07-22 ENCOUNTER — Ambulatory Visit (INDEPENDENT_AMBULATORY_CARE_PROVIDER_SITE_OTHER): Payer: Self-pay | Admitting: Primary Care

## 2021-07-22 ENCOUNTER — Encounter (INDEPENDENT_AMBULATORY_CARE_PROVIDER_SITE_OTHER): Payer: Self-pay | Admitting: Primary Care

## 2021-07-22 VITALS — BP 144/90 | HR 84 | Temp 97.9°F | Resp 94 | Ht 61.0 in | Wt 160.4 lb

## 2021-07-22 DIAGNOSIS — I10 Essential (primary) hypertension: Secondary | ICD-10-CM

## 2021-07-22 DIAGNOSIS — Z1231 Encounter for screening mammogram for malignant neoplasm of breast: Secondary | ICD-10-CM

## 2021-07-22 DIAGNOSIS — J45909 Unspecified asthma, uncomplicated: Secondary | ICD-10-CM

## 2021-07-22 DIAGNOSIS — Z23 Encounter for immunization: Secondary | ICD-10-CM

## 2021-07-22 DIAGNOSIS — Z131 Encounter for screening for diabetes mellitus: Secondary | ICD-10-CM

## 2021-07-22 DIAGNOSIS — E6609 Other obesity due to excess calories: Secondary | ICD-10-CM

## 2021-07-22 DIAGNOSIS — Z6830 Body mass index (BMI) 30.0-30.9, adult: Secondary | ICD-10-CM

## 2021-07-22 LAB — POCT GLYCOSYLATED HEMOGLOBIN (HGB A1C): Hemoglobin A1C: 5.6 % (ref 4.0–5.6)

## 2021-07-22 MED ORDER — BUDESONIDE 90 MCG/ACT IN AEPB
1.0000 | INHALATION_SPRAY | Freq: Two times a day (BID) | RESPIRATORY_TRACT | 6 refills | Status: AC
  Filled 2021-07-22: qty 1, 30d supply, fill #0
  Filled 2021-12-04 – 2022-03-02 (×3): qty 1, 30d supply, fill #1

## 2021-07-22 MED ORDER — AMLODIPINE BESYLATE 10 MG PO TABS
10.0000 mg | ORAL_TABLET | Freq: Every day | ORAL | 1 refills | Status: DC
  Filled 2021-07-22: qty 90, 90d supply, fill #0

## 2021-07-22 MED ORDER — ALBUTEROL SULFATE HFA 108 (90 BASE) MCG/ACT IN AERS
1.0000 | INHALATION_SPRAY | Freq: Four times a day (QID) | RESPIRATORY_TRACT | 2 refills | Status: AC | PRN
  Filled 2021-07-22: qty 18, 25d supply, fill #0

## 2021-07-22 MED ORDER — ALBUTEROL SULFATE HFA 108 (90 BASE) MCG/ACT IN AERS
2.0000 | INHALATION_SPRAY | Freq: Four times a day (QID) | RESPIRATORY_TRACT | 2 refills | Status: DC | PRN
  Filled 2021-07-22: qty 6.7, fill #0

## 2021-07-22 MED ORDER — LORATADINE 10 MG PO TABS
10.0000 mg | ORAL_TABLET | Freq: Every day | ORAL | 3 refills | Status: AC
  Filled 2021-07-22: qty 90, 90d supply, fill #0
  Filled 2021-12-04: qty 30, 30d supply, fill #0
  Filled 2021-12-08 – 2022-03-02 (×3): qty 90, 90d supply, fill #0

## 2021-07-22 MED ORDER — LISINOPRIL-HYDROCHLOROTHIAZIDE 20-25 MG PO TABS
1.0000 | ORAL_TABLET | Freq: Every day | ORAL | 1 refills | Status: DC
  Filled 2021-07-22: qty 30, 30d supply, fill #0

## 2021-07-22 NOTE — Patient Instructions (Signed)
Zoster Vaccine, Recombinant injection What is this medication? ZOSTER VACCINE (ZOS ter vak SEEN) is a vaccine used to reduce the risk of getting shingles. This vaccine is not used to treat shingles or nerve pain from shingles. This medicine may be used for other purposes; ask your health care provider or pharmacist if you have questions. COMMON BRAND NAME(S): [facility] What should I tell my care team before I take this medication? They need to know if you have any of these conditions: cancer immune system problems an unusual or allergic reaction to Zoster vaccine, other medications, foods, dyes, or preservatives pregnant or trying to get pregnant breast-feeding How should I use this medication? This vaccine is injected into a muscle. It is given by a health care provider. A copy of Vaccine Information Statements will be given before each vaccination. Be sure to read this information carefully each time. This sheet may change often. Talk to your health care provider about the use of this vaccine in children. This vaccine is not approved for use in children. Overdosage: If you think you have taken too much of this medicine contact a poison control center or emergency room at once. NOTE: This medicine is only for you. Do not share this medicine with others. What if I miss a dose? Keep appointments for follow-up (booster) doses. It is important not to miss your dose. Call your health care provider if you are unable to keep an appointment. What may interact with this medication? medicines that suppress your immune system medicines to treat cancer steroid medicines like prednisone or cortisone This list may not describe all possible interactions. Give your health care provider a list of all the medicines, herbs, non-prescription drugs, or dietary supplements you use. Also tell them if you smoke, drink alcohol, or use illegal drugs. Some items may interact with your medicine. What should I watch for  while using this medication? Visit your health care provider regularly. This vaccine, like all vaccines, may not fully protect everyone. What side effects may I notice from receiving this medication? Side effects that you should report to your doctor or health care professional as soon as possible: allergic reactions (skin rash, itching or hives; swelling of the face, lips, or tongue) trouble breathing Side effects that usually do not require medical attention (report these to your doctor or health care professional if they continue or are bothersome): chills headache fever nausea pain, redness, or irritation at site where injected tiredness vomiting This list may not describe all possible side effects. Call your doctor for medical advice about side effects. You may report side effects to FDA at 1-800-FDA-1088. Where should I keep my medication? This vaccine is only given by a health care provider. It will not be stored at home. NOTE: This sheet is a summary. It may not cover all possible information. If you have questions about this medicine, talk to your doctor, pharmacist, or health care provider.  2022 Elsevier/Gold Standard (2021-01-15 00:00:00) Influenza, Adult Influenza is also called "the flu." It is an infection in the lungs, nose, and throat (respiratory tract). It spreads easily from person to person (is contagious). The flu causes symptoms that are like a cold, along with high fever and body aches. What are the causes? This condition is caused by the influenza virus. You can get the virus by: Breathing in droplets that are in the air after a person infected with the flu coughed or sneezed. Touching something that has the virus on it and then touching  your mouth, nose, or eyes. What increases the risk? Certain things may make you more likely to get the flu. These include: Not washing your hands often. Having close contact with many people during cold and flu season. Touching  your mouth, eyes, or nose without first washing your hands. Not getting a flu shot every year. You may have a higher risk for the flu, and serious problems, such as a lung infection (pneumonia), if you: Are older than 65. Are pregnant. Have a weakened disease-fighting system (immune system) because of a disease or because you are taking certain medicines. Have a long-term (chronic) condition, such as: Heart, kidney, or lung disease. Diabetes. Asthma. Have a liver disorder. Are very overweight (morbidly obese). Have anemia. What are the signs or symptoms? Symptoms usually begin suddenly and last 4-14 days. They may include: Fever and chills. Headaches, body aches, or muscle aches. Sore throat. Cough. Runny or stuffy (congested) nose. Feeling discomfort in your chest. Not wanting to eat as much as normal. Feeling weak or tired. Feeling dizzy. Feeling sick to your stomach or throwing up. How is this treated? If the flu is found early, you can be treated with antiviral medicine. This can help to reduce how bad the illness is and how long it lasts. This may be given by mouth or through an IV tube. Taking care of yourself at home can help your symptoms get better. Your doctor may want you to: Take over-the-counter medicines. Drink plenty of fluids. The flu often goes away on its own. If you have very bad symptoms or other problems, you may be treated in a hospital. Follow these instructions at home:   Activity Rest as needed. Get plenty of sleep. Stay home from work or school as told by your doctor. Do not leave home until you do not have a fever for 24 hours without taking medicine. Leave home only to go to your doctor. Eating and drinking Take an ORS (oral rehydration solution). This is a drink that is sold at pharmacies and stores. Drink enough fluid to keep your pee pale yellow. Drink clear fluids in small amounts as you are able. Clear fluids include: Water. Ice  chips. Fruit juice mixed with water. Low-calorie sports drinks. Eat bland foods that are easy to digest. Eat small amounts as you are able. These foods include: Bananas. Applesauce. Rice. Lean meats. Toast. Crackers. Do not eat or drink: Fluids that have a lot of sugar or caffeine. Alcohol. Spicy or fatty foods. General instructions Take over-the-counter and prescription medicines only as told by your doctor. Use a cool mist humidifier to add moisture to the air in your home. This can make it easier for you to breathe. When using a cool mist humidifier, clean it daily. Empty water and replace with clean water. Cover your mouth and nose when you cough or sneeze. Wash your hands with soap and water often and for at least 20 seconds. This is also important after you cough or sneeze. If you cannot use soap and water, use alcohol-based hand sanitizer. Keep all follow-up visits. How is this prevented?  Get a flu shot every year. You may get the flu shot in late summer, fall, or winter. Ask your doctor when you should get your flu shot. Avoid contact with people who are sick during fall and winter. This is cold and flu season. Contact a doctor if: You get new symptoms. You have: Chest pain. Watery poop (diarrhea). A fever. Your cough gets worse. You  start to have more mucus. You feel sick to your stomach. You throw up. Get help right away if you: Have shortness of breath. Have trouble breathing. Have skin or nails that turn a bluish color. Have very bad pain or stiffness in your neck. Get a sudden headache. Get sudden pain in your face or ear. Cannot eat or drink without throwing up. These symptoms may represent a serious problem that is an emergency. Get medical help right away. Call your local emergency services (911 in the [place] Do not wait to see if the symptoms will go away. Do not drive yourself to the hospital. Summary Influenza is also called "the flu." It is an  infection in the lungs, nose, and throat. It spreads easily from person to person. Take over-the-counter and prescription medicines only as told by your doctor. Getting a flu shot every year is the best way to not get the flu. This information is not intended to replace advice given to you by your health care provider. Make sure you discuss any questions you have with your health care provider. Document Revised: 12/16/2019 Document Reviewed: 12/16/2019 Elsevier Patient Education  [place].

## 2021-07-22 NOTE — Progress Notes (Incomplete)
Carrie Potts is a 56 y.o. female presents to office today for annual physical exam examination.    Concerns today include: 1. None  Health Maintenance  Topic Date Due   COVID-19 Vaccine (3 - Booster for Pfizer series) 11/09/2019   MAMMOGRAM  06/21/2021   Hepatitis C Screening  09/24/2021 (Originally 08/04/1983)   Zoster Vaccines- Shingrix (2 of 2) 09/16/2021   PAP SMEAR-Modifier  06/21/2022   TETANUS/TDAP  05/17/2029   COLONOSCOPY (Pts 45-52yr Insurance coverage will need to be confirmed)  06/28/2030   INFLUENZA VACCINE  Completed   HIV Screening  Completed   HPV VACCINES  Aged Out     Past Medical History:  Diagnosis Date   Asthma    COPD (chronic obstructive pulmonary disease) ([facility]    HLD (hyperlipidemia)    Hypertension    Seasonal allergies    Social History   Socioeconomic History   Marital status: Single    Spouse name: Not on file   Number of children: 1   Years of education: Not on file   Highest education level: Not on file  Occupational History   Occupation: CNA  Tobacco Use   Smoking status: Every Day    Packs/day: 1.00    Years: 37.00    Pack years: 37.00    Types: Cigarettes   Smokeless tobacco: Never  Vaping Use   Vaping Use: Never used  Substance and Sexual Activity   Alcohol use: Yes    Alcohol/week: 21.0 standard drinks    Types: 21 Cans of beer per week    Comment: 3 beers per day   Drug use: Never   Sexual activity: Not Currently    Birth control/protection: Post-menopausal  Other Topics Concern   Not on file  Social History Narrative   Not on file   Social Determinants of Health   Financial Resource Strain: Not on file  Food Insecurity: Not on file  Transportation Needs: Not on file  Physical Activity: Not on file  Stress: Not on file  Social Connections: Not on file  Intimate Partner Violence: Not on file   Past Surgical History:  Procedure Laterality Date   COLONOSCOPY  2021   Dr  MCollene Mares  COLONOSCOPY WITH PROPOFOL N/A 06/28/2020   Procedure: COLONOSCOPY WITH PROPOFOL;  Surgeon: MIrving Copas, MD;  [place] [facility]  Service: Gastroenterology;  Laterality: N/A;   ENDOSCOPIC MUCOSAL RESECTION N/A 06/28/2020   Procedure: ENDOSCOPIC MUCOSAL RESECTION;  Surgeon: MRush LandmarkGTelford Nab, MD;  [place]  Service: Gastroenterology;  Laterality: N/A;   NO PAST SURGERIES     SUBMUCOSAL LIFTING INJECTION  06/28/2020   Procedure: SUBMUCOSAL LIFTING INJECTION;  Surgeon: MIrving Copas, MD;  [facility];  Service: Gastroenterology;;   Family History  Problem Relation Age of Onset   Healthy Mother    Colon cancer Sister 468  Hypertension Sister    Hypertension Maternal Grandmother    Breast cancer Maternal Aunt    Hypertension Sister    Esophageal cancer Neg Hx    Inflammatory bowel disease Neg Hx    Liver disease Neg Hx    Pancreatic cancer Neg Hx    Stomach cancer Neg Hx     Current Outpatient Medications:    albuterol (VENTOLIN HFA) 108 (90 Base) MCG/ACT inhaler, Inhale 1-2 puffs into the lungs every 6 (six) hours as needed for wheezing or shortness of breath., Disp: 18 g, Rfl: 2   Budesonide 90 MCG/ACT  inhaler, Inhale 1-2 puffs into the lungs 2 (two) times daily., Disp: 1 each, Rfl: 6   amLODipine (NORVASC) 10 MG tablet, Take 1 tablet (10 mg total) by mouth daily., Disp: 90 tablet, Rfl: 1   lisinopril-hydrochlorothiazide (ZESTORETIC) 20-25 MG tablet, Take 1 tablet by mouth daily., Disp: 90 tablet, Rfl: 1   loratadine (CLARITIN) 10 MG tablet, Take 1 tablet (10 mg total) by mouth at bedtime., Disp: 90 tablet, Rfl: 3   Respiratory Therapy Supplies (NEBULIZER/TUBING/MOUTHPIECE) KIT, 1 kit by Does not apply route every 4 (four) hours as needed. (Patient not taking: Reported on 07/22/2021), Disp: 1 kit, Rfl: 1 Outpatient Encounter Medications as of 07/22/2021  Medication Sig   albuterol (VENTOLIN HFA) 108 (90 Base) MCG/ACT inhaler  Inhale 1-2 puffs into the lungs every 6 (six) hours as needed for wheezing or shortness of breath.   Budesonide 90 MCG/ACT inhaler Inhale 1-2 puffs into the lungs 2 (two) times daily.   [DISCONTINUED] amLODipine (NORVASC) 10 MG tablet Take 1 tablet (10 mg total) by mouth daily.   [DISCONTINUED] gabapentin (NEURONTIN) 600 MG tablet Take 600 mg by mouth 3 (three) times daily as needed (back pain.).   [DISCONTINUED] lisinopril-hydrochlorothiazide (ZESTORETIC) 20-25 MG tablet Take 1 tablet by mouth daily.   [DISCONTINUED] loratadine (CLARITIN) 10 MG tablet Take 1 tablet (10 mg total) by mouth at bedtime.   amLODipine (NORVASC) 10 MG tablet Take 1 tablet (10 mg total) by mouth daily.   lisinopril-hydrochlorothiazide (ZESTORETIC) 20-25 MG tablet Take 1 tablet by mouth daily.   loratadine (CLARITIN) 10 MG tablet Take 1 tablet (10 mg total) by mouth at bedtime.   Respiratory Therapy Supplies (NEBULIZER/TUBING/MOUTHPIECE) KIT 1 kit by Does not apply route every 4 (four) hours as needed. (Patient not taking: Reported on 07/22/2021)   [DISCONTINUED] albuterol (VENTOLIN HFA) 108 (90 Base) MCG/ACT inhaler Inhale 2 puffs into the lungs every 6 (six) hours as needed for wheezing or shortness of breath. (Patient not taking: Reported on 07/22/2021)   [DISCONTINUED] albuterol (VENTOLIN HFA) 108 (90 Base) MCG/ACT inhaler Inhale 2 puffs into the lungs every 6 (six) hours as needed for wheezing or shortness of breath.   [DISCONTINUED] buPROPion (WELLBUTRIN SR) 150 MG 12 hr tablet Take 1 tablet (150 mg total) by mouth 2 (two) times daily. (Patient not taking: Reported on 07/22/2021)   [DISCONTINUED] celecoxib (CELEBREX) 100 MG capsule Take 1 capsule (100 mg total) by mouth 2 (two) times daily. (Patient not taking: Reported on 07/22/2021)   [DISCONTINUED] ondansetron (ZOFRAN ODT) 4 MG disintegrating tablet Take 1 tablet (4 mg total) by mouth every 8 (eight) hours as needed for nausea or vomiting.   [DISCONTINUED] simvastatin  (ZOCOR) 20 MG tablet Take 1 tablet (20 mg total) by mouth daily. (Patient not taking: Reported on 07/22/2021)   No facility-administered encounter medications on file as of 07/22/2021.    No Known Allergies   ROS: Review of Systems A comprehensive review of systems was negative.    Physical exam BP (!) 144/90 (BP Location: Right Arm, Patient Position: Sitting, Cuff Size: Normal)    Pulse 84    Temp 97.9 F (36.6 C) (Oral)    Resp (!) 94    Ht 5' 1"  (1.549 m)    Wt 160 lb 6.4 oz (72.8 kg)    LMP  (LMP Unknown)    BMI 30.31 kg/m  General appearance: alert, cooperative, appears stated age, and no distress Head: Normocephalic, without obvious abnormality, atraumatic Eyes: conjunctivae/corneas clear. PERRL, EOM's intact. Fundi benign. Ears:  normal TM's and external ear canals both ears Nose: Nares normal. Septum midline. Mucosa normal. No drainage or sinus tenderness. Neck: no adenopathy, no carotid bruit, no JVD, supple, symmetrical, trachea midline, and thyroid not enlarged, symmetric, no tenderness/mass/nodules Back: symmetric, no curvature. ROM normal. No CVA tenderness. Lungs: wheezes base - right Heart: regular rate and rhythm, S1, S2 normal, no murmur, click, rub or gallop Abdomen: soft, non-tender; bowel sounds normal; no masses,  no organomegaly Extremities: extremities normal, atraumatic, no cyanosis or edema Pulses: 2+ and symmetric Skin: Skin color, texture, turgor normal. No rashes or lesions Lymph nodes: Cervical, supraclavicular, and axillary nodes normal. Neurologic: Alert and oriented X 3, normal strength and tone. Normal symmetric reflexes. Normal coordination and gait    Assessment/ Plan: Carrie Potts here for annual physical exam.  Carrie Potts was seen today for annual exam and medication refill.  Diagnoses and all orders for this visit:  Screening for diabetes mellitus -     HgB A1c 5.6  Need for shingles vaccine -     Varicella-zoster vaccine IM  (Shingrix)  Essential hypertension BP goal - < 130/80 Explained that having normal blood pressure is the goal and medications are helping to get to goal and maintain normal blood pressure. DIET: Limit salt intake, read nutrition labels to check salt content, limit fried and high fatty foods  Avoid using multisymptom OTC cold preparations that generally contain sudafed which can rise BP. Consult with pharmacist on best cold relief products to use for persons with HTN EXERCISE Discussed incorporating exercise such as walking - 30 minutes most days of the week and can do in 10 minute intervals     Class 1 obesity due to excess calories without serious comorbidity with body mass index (BMI) of 30.0 to 30.9 in adult Obesity is 30-39 indicating an excess in caloric intake or underlining conditions. This may lead to other co-morbidities. Lifestyle modifications of diet and exercise may reduce obesity.    Need for immunization against influenza -     Flu Vaccine QUAD 80moIM (Fluarix, Fluzone & Alfiuria Quad PF)  Encounter for screening mammogram for malignant neoplasm of breast Patient completed application for [facility] while in clinic [facility].   Mild asthma, unspecified whether complicated, unspecified whether persistent Other orders -     Discontinue: albuterol (VENTOLIN HFA) 108 (90 Base) MCG/ACT inhaler; Inhale 2 puffs into the lungs every 6 (six) hours as needed for wheezing or shortness of breath. -     amLODipine (NORVASC) 10 MG tablet; Take 1 tablet (10 mg total) by mouth daily. -     lisinopril-hydrochlorothiazide (ZESTORETIC) 20-25 MG tablet; Take 1 tablet by mouth daily. -     Budesonide 90 MCG/ACT inhaler; Inhale 1-2 puffs into the lungs 2 (two) times daily. -     loratadine (CLARITIN) 10 MG tablet; Take 1 tablet (10 mg total) by mouth at bedtime. -     albuterol (VENTOLIN HFA) 108 (90  Base) MCG/ACT inhaler; Inhale 1-2 puffs into the lungs every 6 (six) hours as needed for wheezing or shortness of breath.     Counseled on healthy lifestyle choices, including diet (rich in fruits, vegetables and lean meats and low in salt and simple carbohydrates) and exercise (at least 30 minutes of moderate physical activity daily).  Patient to follow up in 1 year for annual exam or sooner if needed.  The above assessment and  management plan was discussed with the patient. The patient verbalized understanding of and has agreed to the management plan. Patient is aware to call the clinic if symptoms persist or worsen. Patient is aware when to return to the clinic for a follow-up visit. Patient educated on when it is appropriate to go to the emergency department.   This note has been created with Surveyor, quantity. Any transcriptional errors are unintentional.   Kerin Perna, NP 07/22/2021, 1:56 PM

## 2021-07-23 ENCOUNTER — Other Ambulatory Visit: Payer: Self-pay

## 2021-07-25 ENCOUNTER — Other Ambulatory Visit: Payer: Self-pay

## 2021-07-30 ENCOUNTER — Encounter: Payer: Self-pay | Admitting: Gastroenterology

## 2021-09-04 ENCOUNTER — Encounter (INDEPENDENT_AMBULATORY_CARE_PROVIDER_SITE_OTHER): Payer: Self-pay | Admitting: Primary Care

## 2021-09-04 ENCOUNTER — Ambulatory Visit (INDEPENDENT_AMBULATORY_CARE_PROVIDER_SITE_OTHER): Payer: Self-pay | Admitting: Primary Care

## 2021-09-04 VITALS — BP 116/78 | HR 84 | Temp 98.1°F | Ht 61.0 in | Wt 161.2 lb

## 2021-09-04 DIAGNOSIS — Z0130 Encounter for examination of blood pressure without abnormal findings: Secondary | ICD-10-CM

## 2021-09-11 ENCOUNTER — Other Ambulatory Visit: Payer: Self-pay

## 2021-09-11 ENCOUNTER — Other Ambulatory Visit (INDEPENDENT_AMBULATORY_CARE_PROVIDER_SITE_OTHER): Payer: Self-pay | Admitting: Primary Care

## 2021-09-11 DIAGNOSIS — I10 Essential (primary) hypertension: Secondary | ICD-10-CM

## 2021-09-11 MED ORDER — LISINOPRIL-HYDROCHLOROTHIAZIDE 20-25 MG PO TABS
1.0000 | ORAL_TABLET | Freq: Every day | ORAL | 1 refills | Status: DC
  Filled 2021-09-11: qty 30, 30d supply, fill #0
  Filled 2021-12-04 – 2022-03-02 (×3): qty 90, 90d supply, fill #0

## 2021-09-11 MED ORDER — AMLODIPINE BESYLATE 10 MG PO TABS
10.0000 mg | ORAL_TABLET | Freq: Every day | ORAL | 1 refills | Status: DC
  Filled 2021-09-11 – 2022-03-02 (×4): qty 90, 90d supply, fill #0

## 2021-09-11 NOTE — Progress Notes (Signed)
?[place] ? ? ?[name] is a 56 y.o. female presents for hypertension evaluation, Denies shortness of breath, headaches, chest pain or lower extremity edema, sudden onset, vision changes, unilateral weakness, dizziness, paresthesias  ? ?Patient reports adherence with medications. ? ?Dietary habits include: modification of diet sodium  ?Exercise habits include: walking ?Family / Social history: HTN / colon cancer  ? ? ?Past Medical History:  ?Diagnosis Date  ? Asthma   ? COPD (chronic obstructive pulmonary disease) ([place]   ? HLD (hyperlipidemia)   ? Hypertension   ? Seasonal allergies   ? ?Past Surgical History:  ?Procedure Laterality Date  ? COLONOSCOPY  2021  ? Dr Collene Mares  ? COLONOSCOPY WITH PROPOFOL N/A 06/28/2020  ? Procedure: COLONOSCOPY WITH PROPOFOL;  Surgeon: Mansouraty, Telford Nab., MD;  Location: [place];  Service: Gastroenterology;  Laterality: N/A;  ? ENDOSCOPIC MUCOSAL RESECTION N/A 06/28/2020  ? Procedure: ENDOSCOPIC MUCOSAL RESECTION;  Surgeon: Rush Landmark Telford Nab., MD;  Location: [place];  Service: Gastroenterology;  Laterality: N/A;  ? NO PAST SURGERIES    ? SUBMUCOSAL LIFTING INJECTION  06/28/2020  ? Procedure: SUBMUCOSAL LIFTING INJECTION;  Surgeon: Irving Copas., MD;  Location: [place];  Service: Gastroenterology;;  ? ?No Known Allergies ?Current Outpatient Medications on File Prior to Visit  ?Medication Sig Dispense Refill  ? albuterol (VENTOLIN HFA) 108 (90 Base) MCG/ACT inhaler Inhale 1-2 puffs into the lungs every 6 (six) hours as needed for wheezing or shortness of breath. 18 g 2  ? amLODipine (NORVASC) 10 MG tablet Take 1 tablet (10 mg total) by mouth daily. 90 tablet 1  ? Budesonide 90 MCG/ACT inhaler Inhale 1-2 puffs into the lungs 2 (two) times daily. 1 each 6  ? lisinopril-hydrochlorothiazide (ZESTORETIC) 20-25 MG tablet Take 1 tablet by mouth daily. 90 tablet 1  ? loratadine (CLARITIN) 10 MG tablet Take 1 tablet (10 mg total) by  mouth at bedtime. 90 tablet 3  ? Respiratory Therapy Supplies (NEBULIZER/TUBING/MOUTHPIECE) KIT 1 kit by Does not apply route every 4 (four) hours as needed. (Patient not taking: Reported on 07/22/2021) 1 kit 1  ? ?No current facility-administered medications on file prior to visit.  ? ?Social History  ? ?Socioeconomic History  ? Marital status: Single  ?  Spouse name: Not on file  ? Number of children: 1  ? Years of education: Not on file  ? Highest education level: Not on file  ?Occupational History  ? Occupation: CNA  ?Tobacco Use  ? Smoking status: Every Day  ?  Packs/day: 1.00  ?  Years: 37.00  ?  Pack years: 37.00  ?  Types: Cigarettes  ? Smokeless tobacco: Never  ?Vaping Use  ? Vaping Use: Never used  ?Substance and Sexual Activity  ? Alcohol use: Yes  ?  Alcohol/week: 21.0 standard drinks  ?  Types: 21 Cans of beer per week  ?  Comment: 3 beers per day  ? Drug use: Never  ? Sexual activity: Not Currently  ?  Birth control/protection: Post-menopausal  ?Other Topics Concern  ? Not on file  ?Social History Narrative  ? Not on file  ? ?Social Determinants of Health  ? ?Financial Resource Strain: Not on file  ?Food Insecurity: Not on file  ?Transportation Needs: Not on file  ?Physical Activity: Not on file  ?Stress: Not on file  ?Social Connections: Not on file  ?Intimate Partner Violence: Not on file  ? ?Family History  ?Problem Relation Age of Onset  ? Healthy  Mother   ? Colon cancer Sister 96  ? Hypertension Sister   ? Hypertension Maternal Grandmother   ? Breast cancer Maternal Aunt   ? Hypertension Sister   ? Esophageal cancer Neg Hx   ? Inflammatory bowel disease Neg Hx   ? Liver disease Neg Hx   ? Pancreatic cancer Neg Hx   ? Stomach cancer Neg Hx   ? ? ? ?OBJECTIVE: ? ?Vitals:  ? 09/04/21 1039  ?BP: 116/78  ?Pulse: 84  ?Temp: 98.1 ?F (36.7 ?C)  ?TempSrc: Oral  ?SpO2: 94%  ?Weight: 161 lb 3.2 oz (73.1 kg)  ?Height: _0  (1.549 m)  ? ? ?Physical Exam ?Vitals reviewed.  ?Constitutional:   ?   Appearance:  Normal appearance. She is obese.  ?HENT:  ?   Head: Normocephalic.  ?   Right Ear: Tympanic membrane normal.  ?   Left Ear: Tympanic membrane normal.  ?   Nose: Nose normal.  ?Eyes:  ?   Extraocular Movements: Extraocular movements intact.  ?   Pupils: Pupils are equal, round, and reactive to light.  ?Cardiovascular:  ?   Rate and Rhythm: Normal rate and regular rhythm.  ?Pulmonary:  ?   Effort: Pulmonary effort is normal.  ?   Breath sounds: Normal breath sounds.  ?Abdominal:  ?   General: Bowel sounds are normal. There is distension.  ?   Palpations: Abdomen is soft.  ?Musculoskeletal:     ?   General: Normal range of motion.  ?Skin: ?   General: Skin is warm and dry.  ?Neurological:  ?   Mental Status: She is alert and oriented to person, place, and time.  ?Psychiatric:     ?   Mood and Affect: Mood normal.     ?   Behavior: Behavior normal.     ?   Thought Content: Thought content normal.     ?   Judgment: Judgment normal.  ? ? ?ROS ?Comprehensive ROS Pertinent positive and negative noted in HPI   ?Last 3 Office BP readings: ?BP Readings from Last 3 Encounters:  ?09/04/21 116/78  ?07/22/21 (!) 144/90  ?12/28/20 127/82  ? ? ?BMET ?   ?Component Value Date/Time  ? NA 138 12/11/2020 2045  ? NA 139 05/18/2019 1147  ? K 3.6 12/11/2020 2045  ? CL 100 12/11/2020 2045  ? CO2 26 12/11/2020 2045  ? GLUCOSE 126 (H) 12/11/2020 2045  ? BUN 24 (H) 12/11/2020 2045  ? BUN 15 05/18/2019 1147  ? CREATININE 1.71 (H) 12/11/2020 2045  ? CALCIUM 9.2 12/11/2020 2045  ? GFRNONAA 35 (L) 12/11/2020 2045  ? GFRAA 119 05/18/2019 1147  ? ? ?Renal function: ?CrCl cannot be calculated (Patient's most recent lab result is older than the maximum 21 days allowed.). ? ?Clinical ASCVD: Yes  ?The 10-year ASCVD risk score (Arnett DK, et al., 2019) is: 6.1% ?  Values used to calculate the score: ?    Age: 37 years ?    Sex: Female ?    Is Non-Hispanic African American: Yes ?    Diabetic: No ?    Tobacco smoker: Yes ?    Systolic Blood Pressure: 782  mmHg ?    Is BP treated: Yes ?    HDL Cholesterol: 54 mg/dL ?    Total Cholesterol: 187 mg/dL ? ?ASCVD risk factors include- [place] ? ? ?ASSESSMENT & PLAN: ?Znya was seen today for hypertension and follow-up. ? ?Diagnoses and all orders for this visit: ? ?Blood  pressure check ?-Counseled on lifestyle modifications for blood pressure control including reduced dietary sodium, increased exercise, weight reduction and adequate sleep. Also, educated patient about the risk for cardiovascular events, stroke and heart attack. Also counseled patient about the importance of medication adherence. If you participate in smoking, it is important to stop using tobacco as this will increase the risks associated with uncontrolled blood pressure.  ?Goal BP:  ?For patients younger than 60: Goal BP < 130/80. ?For patients 60 and older: Goal BP < 140/90. ?For patients with diabetes: Goal BP < 130/80. ?Your most recent BP: 116/78 ? ?Minimize salt intake. ?Minimize alcohol intake ? ? ? ?This note has been created with Surveyor, quantity. Any transcriptional errors are unintentional.  ? ?Kerin Perna, NP ?09/11/2021, 9:44 AM ?  ?

## 2021-09-18 ENCOUNTER — Other Ambulatory Visit: Payer: Self-pay

## 2021-12-04 ENCOUNTER — Other Ambulatory Visit: Payer: Self-pay

## 2021-12-04 ENCOUNTER — Ambulatory Visit (INDEPENDENT_AMBULATORY_CARE_PROVIDER_SITE_OTHER): Payer: Self-pay | Admitting: Primary Care

## 2021-12-09 ENCOUNTER — Other Ambulatory Visit: Payer: Self-pay

## 2021-12-11 ENCOUNTER — Other Ambulatory Visit: Payer: Self-pay

## 2022-01-24 ENCOUNTER — Other Ambulatory Visit: Payer: Self-pay

## 2022-01-31 ENCOUNTER — Other Ambulatory Visit: Payer: Self-pay

## 2022-03-03 ENCOUNTER — Other Ambulatory Visit: Payer: Self-pay

## 2022-03-04 ENCOUNTER — Other Ambulatory Visit: Payer: Self-pay

## 2022-07-25 DIAGNOSIS — Z23 Encounter for immunization: Secondary | ICD-10-CM | POA: Diagnosis not present

## 2022-07-25 DIAGNOSIS — Z1211 Encounter for screening for malignant neoplasm of colon: Secondary | ICD-10-CM | POA: Diagnosis not present

## 2022-07-25 DIAGNOSIS — Z86010 Personal history of colonic polyps: Secondary | ICD-10-CM | POA: Diagnosis not present

## 2022-07-25 DIAGNOSIS — Z1231 Encounter for screening mammogram for malignant neoplasm of breast: Secondary | ICD-10-CM | POA: Diagnosis not present

## 2022-07-25 DIAGNOSIS — R2232 Localized swelling, mass and lump, left upper limb: Secondary | ICD-10-CM | POA: Diagnosis not present

## 2022-07-25 DIAGNOSIS — R92323 Mammographic fibroglandular density, bilateral breasts: Secondary | ICD-10-CM | POA: Diagnosis not present

## 2022-08-05 DIAGNOSIS — R2232 Localized swelling, mass and lump, left upper limb: Secondary | ICD-10-CM | POA: Diagnosis not present

## 2022-08-15 DIAGNOSIS — Z0000 Encounter for general adult medical examination without abnormal findings: Secondary | ICD-10-CM | POA: Diagnosis not present

## 2022-08-15 DIAGNOSIS — M79644 Pain in right finger(s): Secondary | ICD-10-CM | POA: Diagnosis not present

## 2022-08-15 DIAGNOSIS — Z720 Tobacco use: Secondary | ICD-10-CM | POA: Diagnosis not present

## 2022-08-15 DIAGNOSIS — R053 Chronic cough: Secondary | ICD-10-CM | POA: Diagnosis not present

## 2022-08-15 DIAGNOSIS — Z113 Encounter for screening for infections with a predominantly sexual mode of transmission: Secondary | ICD-10-CM | POA: Diagnosis not present

## 2022-08-15 DIAGNOSIS — Z124 Encounter for screening for malignant neoplasm of cervix: Secondary | ICD-10-CM | POA: Diagnosis not present

## 2022-08-17 ENCOUNTER — Ambulatory Visit (HOSPITAL_COMMUNITY)
Admission: EM | Admit: 2022-08-17 | Discharge: 2022-08-17 | Disposition: A | Discharge status: Home / Self Care | Payer: Medicaid Other | Attending: Emergency Medicine | Admitting: Emergency Medicine

## 2022-08-17 ENCOUNTER — Encounter (HOSPITAL_COMMUNITY): Payer: Self-pay

## 2022-08-17 DIAGNOSIS — I10 Essential (primary) hypertension: Secondary | ICD-10-CM

## 2022-08-17 DIAGNOSIS — N9089 Other specified noninflammatory disorders of vulva and perineum: Secondary | ICD-10-CM | POA: Diagnosis not present

## 2022-08-17 MED ORDER — SULFAMETHOXAZOLE-TRIMETHOPRIM 800-160 MG PO TABS: 1.0000 | ORAL_TABLET | Freq: Two times a day (BID) | ORAL | 0 refills | Status: AC

## 2022-08-17 MED ORDER — AMLODIPINE BESYLATE 10 MG PO TABS: 10.0000 mg | ORAL_TABLET | Freq: Every day | ORAL | 1 refills | Status: DC

## 2022-08-17 MED ORDER — ACETAMINOPHEN 325 MG PO TABS
975.0000 mg | ORAL_TABLET | Freq: Once | ORAL | Status: AC
  Administered 2022-08-17: 975 mg via ORAL

## 2022-08-17 MED ORDER — LISINOPRIL-HYDROCHLOROTHIAZIDE 20-25 MG PO TABS: 1.0000 | ORAL_TABLET | Freq: Every day | ORAL | 1 refills | Status: AC

## 2022-08-17 MED ORDER — ACETAMINOPHEN 325 MG PO TABS
ORAL_TABLET | ORAL | Status: AC
  Filled 2022-08-17: qty 3

## 2022-08-17 NOTE — ED Provider Notes (Signed)
[facility]    CSN: [id number] Arrival date & time: 08/17/22  1004      History   Chief Complaint Chief Complaint  Patient presents with   Abscess    HPI Emerita Bonis is a 57 y.o. female.  Here for possible abscess of the labia x 1 day Pain is 10/10 No history of this Has not taken any medications  Hx HTN. Takes lisinopril/HCTZ Her BP at physical on 4/5 was normal  Past Medical History:  Diagnosis Date   Asthma    COPD (chronic obstructive pulmonary disease)    HLD (hyperlipidemia)    Hypertension    Seasonal allergies     Patient Active Problem List   Diagnosis Date Noted   Hypertension 12/28/2020   Abnormal colonoscopy 04/20/2020   History of colon polyps 04/20/2020   Family history of colon cancer 04/20/2020    Past Surgical History:  Procedure Laterality Date   COLONOSCOPY  2021   Dr Loreta Ave   COLONOSCOPY WITH PROPOFOL N/A 06/28/2020   Procedure: COLONOSCOPY WITH PROPOFOL;  Surgeon: Lemar Lofty., MD;  Location: [facility] ENDOSCOPY;  Service: Gastroenterology;  Laterality: N/A;   ENDOSCOPIC MUCOSAL RESECTION N/A 06/28/2020   Procedure: ENDOSCOPIC MUCOSAL RESECTION;  Surgeon: Meridee Score Netty Starring., MD;  Location: [facility] ENDOSCOPY;  Service: Gastroenterology;  Laterality: N/A;   NO PAST SURGERIES     SUBMUCOSAL LIFTING INJECTION  06/28/2020   Procedure: SUBMUCOSAL LIFTING INJECTION;  Surgeon: Meridee Score Netty Starring., MD;  Location: [facility] ENDOSCOPY;  Service: Gastroenterology;;    OB History   No obstetric history on file.      Home Medications    Prior to Admission medications   Medication Sig Start Date End Date Taking? Authorizing Provider  albuterol (VENTOLIN HFA) 108 (90 Base) MCG/ACT inhaler Inhale 1-2 puffs into the lungs every 6 (six) hours as needed for wheezing or shortness of breath. 07/22/21  Yes Grayce Sessions, NP  loratadine (CLARITIN) 10 MG tablet Take 1 tablet (10 mg total) by mouth at bedtime. 07/22/21  Yes Grayce Sessions, NP  sulfamethoxazole-trimethoprim (BACTRIM DS) 800-160 MG tablet Take 1 tablet by mouth 2 (two) times daily for 5 days. 08/17/22 08/22/22 Yes Lawrance Wiedemann, Lurena Joiner, PA-C  Budesonide 90 MCG/ACT inhaler Inhale 1-2 puffs into the lungs 2 (two) times daily. 07/22/21   Grayce Sessions, NP  lisinopril-hydrochlorothiazide (ZESTORETIC) 20-25 MG tablet Take 1 tablet by mouth daily. 08/17/22   Adrain Nesbit, Lurena Joiner, PA-C    Family History Family History  Problem Relation Age of Onset   Healthy Mother    Colon cancer Sister 15   Hypertension Sister    Hypertension Maternal Grandmother    Breast cancer Maternal Aunt    Hypertension Sister    Esophageal cancer Neg Hx    Inflammatory bowel disease Neg Hx    Liver disease Neg Hx    Pancreatic cancer Neg Hx    Stomach cancer Neg Hx     Social History Social History   Tobacco Use   Smoking status: Every Day    Packs/day: 1.00    Years: 37.00    Additional pack years: 0.00    Total pack years: 37.00    Types: Cigarettes   Smokeless tobacco: Never  Vaping Use   Vaping Use: Never used  Substance Use Topics   Alcohol use: Yes    Alcohol/week: 21.0 standard drinks of alcohol    Types: 21 Cans of beer per week    Comment: 3 beers per day  Drug use: Never     Allergies   Patient has no known allergies.   Review of Systems Review of Systems As per HPI  Physical Exam Triage Vital Signs ED Triage Vitals  Enc Vitals Group     BP 08/17/22 1023 (!) 190/72     Pulse Rate 08/17/22 1023 98     Resp 08/17/22 1023 12     Temp 08/17/22 1023 98.1 F (36.7 C)     Temp Source 08/17/22 1023 Oral     SpO2 08/17/22 1023 97 %     Weight --      Height --      Head Circumference --      Peak Flow --      Pain Score 08/17/22 1026 10     Pain Loc --      Pain Edu? --      Excl. in GC? --    No data found.  Updated Vital Signs BP (!) 168/82 (BP Location: Left Arm)   Pulse 74   Temp 98.1 F (36.7 C) (Oral)   Resp 12   LMP  (LMP  Unknown)   SpO2 95%    Physical Exam Vitals and nursing note reviewed. Exam conducted with a chaperone present Myrene Buddy(Yvonne CMA).  Constitutional:      General: She is not in acute distress. HENT:     Mouth/Throat:     Pharynx: Oropharynx is clear.  Cardiovascular:     Rate and Rhythm: Normal rate and regular rhythm.  Pulmonary:     Effort: Pulmonary effort is normal.  Genitourinary:    Exam position: Supine.     Pubic Area: No rash.      Labia:        Right: Tenderness and lesion present.        Left: No lesion.        Comments: Small lesion on the labia that is exquisitely tender Neurological:     Mental Status: She is alert and oriented to person, place, and time.      UC Treatments / Results  Labs (all labs ordered are listed, but only abnormal results are displayed) Labs Reviewed - No data to display  EKG   Radiology No results found.  Procedures Procedures (including critical care time)  Medications Ordered in UC Medications  acetaminophen (TYLENOL) tablet 975 mg (975 mg Oral Given 08/17/22 1051)    Initial Impression / Assessment and Plan / UC Course  I have reviewed the triage vital signs and the nursing notes.  Pertinent labs & imaging results that were available during my care of the patient were reviewed by me and considered in my medical decision making (see chart for details).  Labial lesion  Suspect beginning of abscess given how much pain patient has. Cover with bactrim BID x 5. Tylenol dose given in clinic. Recommend pain control at home, continue warm compress, and follow up with ob/gyn. Provided with clinic info.  HTN Was normotensive at her physical 2 days ago. Elevated here likely due to pain. Advised to continue BP meds as prescribed. Refilled lisinopril/HCTZ  Final Clinical Impressions(s) / UC Diagnoses   Final diagnoses:  Labial lesion     Discharge Instructions      Please take medication as prescribed. Take with food to avoid  upset stomach. Please finish the full course (no leftover pills)  Continue warm compress. Use tylenol and/or ibuprofen for pain control  Please call the ob/gyn clinic to follow up  Please continue to take your blood pressure medication daily.      ED Prescriptions     Medication Sig Dispense Auth. Provider   sulfamethoxazole-trimethoprim (BACTRIM DS) 800-160 MG tablet Take 1 tablet by mouth 2 (two) times daily for 5 days. 10 tablet Christohper Dube, PA-C   amLODipine (NORVASC) 10 MG tablet  (Status: Discontinued) Take 1 tablet (10 mg total) by mouth daily. 30 tablet Brett Soza, PA-C   lisinopril-hydrochlorothiazide (ZESTORETIC) 20-25 MG tablet Take 1 tablet by mouth daily. 30 tablet Carvel Huskins, Lurena Joiner, PA-C      PDMP not reviewed this encounter.   Marlow Baars, [place] 08/17/22 1138

## 2022-08-17 NOTE — Discharge Instructions (Addendum)
Please take medication as prescribed. Take with food to avoid upset stomach. Please finish the full course (no leftover pills)  Continue warm compress. Use tylenol and/or ibuprofen for pain control  Please call the ob/gyn clinic to follow up  Please continue to take your blood pressure medication daily.

## 2022-08-17 NOTE — ED Triage Notes (Signed)
Pt is here for a abscess on the vagina  on the right side . Pt did not take any OTC meds for the pain .

## 2022-08-22 DIAGNOSIS — D1722 Benign lipomatous neoplasm of skin and subcutaneous tissue of left arm: Secondary | ICD-10-CM | POA: Diagnosis not present

## 2022-08-22 DIAGNOSIS — Z86010 Personal history of colonic polyps: Secondary | ICD-10-CM | POA: Diagnosis not present

## 2022-08-22 DIAGNOSIS — D179 Benign lipomatous neoplasm, unspecified: Secondary | ICD-10-CM | POA: Insufficient documentation

## 2022-09-03 DIAGNOSIS — I10 Essential (primary) hypertension: Secondary | ICD-10-CM | POA: Diagnosis not present

## 2022-09-03 DIAGNOSIS — D1722 Benign lipomatous neoplasm of skin and subcutaneous tissue of left arm: Secondary | ICD-10-CM | POA: Diagnosis not present

## 2022-09-03 DIAGNOSIS — E669 Obesity, unspecified: Secondary | ICD-10-CM | POA: Diagnosis not present

## 2022-09-03 DIAGNOSIS — J449 Chronic obstructive pulmonary disease, unspecified: Secondary | ICD-10-CM | POA: Diagnosis not present

## 2022-09-18 ENCOUNTER — Other Ambulatory Visit: Payer: Self-pay

## 2022-09-22 ENCOUNTER — Encounter: Payer: Self-pay | Admitting: Gastroenterology

## 2022-09-26 DIAGNOSIS — Z1211 Encounter for screening for malignant neoplasm of colon: Secondary | ICD-10-CM | POA: Insufficient documentation

## 2022-09-26 DIAGNOSIS — Z800 Family history of malignant neoplasm of digestive organs: Secondary | ICD-10-CM | POA: Insufficient documentation

## 2022-10-08 ENCOUNTER — Ambulatory Visit (AMBULATORY_SURGERY_CENTER): Payer: Medicaid Other

## 2022-10-08 VITALS — Ht 61.0 in | Wt 168.0 lb

## 2022-10-08 DIAGNOSIS — Z800 Family history of malignant neoplasm of digestive organs: Secondary | ICD-10-CM

## 2022-10-08 MED ORDER — NA SULFATE-K SULFATE-MG SULF 17.5-3.13-1.6 GM/177ML PO SOLN: 1.0000 | Freq: Once | ORAL | 0 refills | Status: AC

## 2022-10-08 NOTE — Progress Notes (Signed)
No egg or soy allergy known to patient  No issues known to pt with past sedation with any surgeries or procedures Patient denies ever being told they had issues or difficulty with intubation  No FH of Malignant Hyperthermia Pt is not on diet pills Pt is not on  home 02  Pt is not on blood thinners  Pt reports constipation - instructions given for extra miralax per MD  No A fib or A flutter Have any cardiac testing pending--no Pt is ambulatory    PV completed with patient. Instructions reviewed and sent out via mychart and to home address. Good rx coupon for [facility] and [facility] provided Pt instructed to use Single[employer] or GoodRx for a price reduction on prep

## 2022-10-13 DIAGNOSIS — M65311 Trigger thumb, right thumb: Secondary | ICD-10-CM | POA: Diagnosis not present

## 2022-10-23 ENCOUNTER — Telehealth: Payer: Self-pay | Admitting: Gastroenterology

## 2022-10-23 NOTE — Telephone Encounter (Signed)
Tried to return the patient's phone call several attempts and the phone rang then a busy signal .

## 2022-10-23 NOTE — Telephone Encounter (Signed)
PT advised to call back at [phone]

## 2022-10-23 NOTE — Telephone Encounter (Signed)
PT is scheduled for colonoscopy on tomorrow at 330. She began taking her prep on yesterday thinking the appointment was today. Please advise.

## 2022-10-23 NOTE — Telephone Encounter (Signed)
Pt stated that she started her prep last night, and finished the second half of the prep this morning. She said her last BM was clear liquid. Advised pt to stay on clear liquids for the rest of the day. Instructed pt to drink 119mg  of miralax and 32oz of Gatorade this evening beginning at 5-6pm today, and to remain on clear liquids until 12:30pm tomorrow. Pt verbalized understanding, and had no further concerns at the end of the call.

## 2022-10-24 ENCOUNTER — Ambulatory Visit (AMBULATORY_SURGERY_CENTER): Payer: Medicaid Other | Admitting: Gastroenterology

## 2022-10-24 ENCOUNTER — Encounter: Payer: Self-pay | Admitting: Gastroenterology

## 2022-10-24 VITALS — BP 130/82 | HR 85 | Temp 97.3°F | Resp 16 | Ht 61.0 in | Wt 168.0 lb

## 2022-10-24 DIAGNOSIS — Z86010 Personal history of colonic polyps: Secondary | ICD-10-CM | POA: Diagnosis not present

## 2022-10-24 DIAGNOSIS — K644 Residual hemorrhoidal skin tags: Secondary | ICD-10-CM | POA: Diagnosis not present

## 2022-10-24 DIAGNOSIS — Z800 Family history of malignant neoplasm of digestive organs: Secondary | ICD-10-CM | POA: Diagnosis not present

## 2022-10-24 DIAGNOSIS — J449 Chronic obstructive pulmonary disease, unspecified: Secondary | ICD-10-CM | POA: Diagnosis not present

## 2022-10-24 DIAGNOSIS — K5730 Diverticulosis of large intestine without perforation or abscess without bleeding: Secondary | ICD-10-CM

## 2022-10-24 DIAGNOSIS — K641 Second degree hemorrhoids: Secondary | ICD-10-CM

## 2022-10-24 DIAGNOSIS — I10 Essential (primary) hypertension: Secondary | ICD-10-CM | POA: Diagnosis not present

## 2022-10-24 DIAGNOSIS — Z1211 Encounter for screening for malignant neoplasm of colon: Secondary | ICD-10-CM | POA: Diagnosis not present

## 2022-10-24 DIAGNOSIS — J45909 Unspecified asthma, uncomplicated: Secondary | ICD-10-CM | POA: Diagnosis not present

## 2022-10-24 MED ORDER — SODIUM CHLORIDE 0.9 % IV SOLN: 500.0000 mL | Freq: Once | INTRAVENOUS | Status: DC

## 2022-10-24 NOTE — Progress Notes (Signed)
PT taken to PACU. Monitors in place. VSS. Report given to RN. 

## 2022-10-24 NOTE — Op Note (Signed)
[facility] Patient Name: Carrie Potts Procedure Date: 10/24/2022 2:45 PM MRN: [id number] Endoscopist: Corliss Parish , MD, [id number] Age: 57 Referring MD:  Date of Birth: August 12, 1965 Gender: Female Account #: [id number] Procedure:                Colonoscopy Indications:              Screening in patient at increased risk: Colorectal                            cancer in sister before age 11, High risk colon                            cancer surveillance: Personal history of colonic                            polyps (previous inflammatory polyp status post                            resection) Medicines:                Monitored Anesthesia Care Procedure:                Pre-Anesthesia Assessment:                           - Prior to the procedure, a History and Physical                            was performed, and patient medications and                            allergies were reviewed. The patient's tolerance of                            previous anesthesia was also reviewed. The risks                            and benefits of the procedure and the sedation                            options and risks were discussed with the patient.                            All questions were answered, and informed consent                            was obtained. Prior Anticoagulants: The patient has                            taken no anticoagulant or antiplatelet agents. ASA                            Grade Assessment: II - A patient with mild systemic  disease. After reviewing the risks and benefits,                            the patient was deemed in satisfactory condition to                            undergo the procedure.                           After obtaining informed consent, the colonoscope                            was passed under direct vision. Throughout the                            procedure, the patient's blood pressure, pulse,  and                            oxygen saturations were monitored continuously. The                            Olympus CF-[id number]) Colonoscope was                            introduced through the anus and advanced to the 3                            cm into the ileum. The colonoscopy was performed                            without difficulty. The patient tolerated the                            procedure. The quality of the bowel preparation was                            good. The terminal ileum, ileocecal valve,                            appendiceal orifice, and rectum were photographed. Scope In: 3:07:03 PM Scope Out: 3:23:47 PM Scope Withdrawal Time: 0 hours 14 minutes 26 seconds  Total Procedure Duration: 0 hours 16 minutes 44 seconds  Findings:                 Skin tags were found on perianal exam.                           The digital rectal exam findings include                            hemorrhoids. Pertinent negatives include no                            palpable rectal lesions.  The terminal ileum and ileocecal valve appeared                            normal.                           A medium post mucosectomy scar was found at the                            ileocecal valve. The scar tissue was healthy in                            appearance.                           Multiple small-mouthed diverticula were found in                            the entire colon.                           Normal mucosa was found in the entire colon                            otherwise.                           Non-bleeding non-thrombosed external and internal                            hemorrhoids were found during retroflexion, during                            perianal exam and during digital exam. The                            hemorrhoids were Grade II (internal hemorrhoids                            that prolapse but reduce  spontaneously). Complications:            No immediate complications. Estimated Blood Loss:     Estimated blood loss was minimal. Impression:               - Perianal skin tags found on perianal exam.                            Hemorrhoids found on digital rectal exam.                           - The examined portion of the ileum was normal.                           - Post mucosectomy scar at the ileocecal valve.                           -  Diverticulosis in the entire examined colon.                           - Normal mucosa in the entire examined colon                            otherwise.                           - Non-bleeding non-thrombosed external and internal                            hemorrhoids. Recommendation:           - The patient will be observed post-procedure,                            until all discharge criteria are met.                           - Discharge patient to home.                           - Patient has a contact number available for                            emergencies. The signs and symptoms of potential                            delayed complications were discussed with the                            patient. Return to normal activities tomorrow.                            Written discharge instructions were provided to the                            patient.                           - High fiber diet.                           - Use FiberCon 1-2 tablets PO daily.                           - Continue present medications.                           - Repeat colonoscopy in 5 years for screening                            purposes due to family history.                           - The findings and recommendations were discussed  with the patient.                           - The findings and recommendations were discussed                            with the patient's family. Corliss Parish, MD 10/24/2022 3:28:57 PM

## 2022-10-24 NOTE — Progress Notes (Signed)
GASTROENTEROLOGY PROCEDURE H&P NOTE   Primary Care Physician: Barnie Mort, PA-C  HPI: Carrie Potts is a 57 y.o. female who presents for Colonoscopy for followup/surveillance of previous colon polyps (in 2022 had ICV inflammatory polyp/mass but on repeat Colonsocopy this was improved and resection showed no dysplasia).  Past Medical History:  Diagnosis Date   Asthma    COPD (chronic obstructive pulmonary disease) (HCC)    HLD (hyperlipidemia)    Hypertension    Seasonal allergies    Past Surgical History:  Procedure Laterality Date   COLONOSCOPY  2021   Dr Loreta Ave   COLONOSCOPY WITH PROPOFOL N/A 06/28/2020   Procedure: COLONOSCOPY WITH PROPOFOL;  Surgeon: Lemar Lofty., MD;  Location: [facility] ENDOSCOPY;  Service: Gastroenterology;  Laterality: N/A;   ENDOSCOPIC MUCOSAL RESECTION N/A 06/28/2020   Procedure: ENDOSCOPIC MUCOSAL RESECTION;  Surgeon: Meridee Score Netty Starring., MD;  Location: [facility] ENDOSCOPY;  Service: Gastroenterology;  Laterality: N/A;   NO PAST SURGERIES     SUBMUCOSAL LIFTING INJECTION  06/28/2020   Procedure: SUBMUCOSAL LIFTING INJECTION;  Surgeon: Lemar Lofty., MD;  Location: [facility] ENDOSCOPY;  Service: Gastroenterology;;   Current Outpatient Medications  Medication Sig Dispense Refill   albuterol (VENTOLIN HFA) 108 (90 Base) MCG/ACT inhaler Inhale 1-2 puffs into the lungs every 6 (six) hours as needed for wheezing or shortness of breath. 18 g 2   amLODipine (NORVASC) 10 MG tablet Take 10 mg by mouth daily.     Budesonide 90 MCG/ACT inhaler Inhale 1-2 puffs into the lungs 2 (two) times daily. 1 each 6   HYDROcodone-acetaminophen (NORCO/VICODIN) 5-325 MG tablet Take 1-2 tablets by mouth.     lisinopril-hydrochlorothiazide (ZESTORETIC) 20-25 MG tablet Take 1 tablet by mouth daily. 30 tablet 1   loratadine (CLARITIN) 10 MG tablet Take 1 tablet (10 mg total) by mouth at bedtime. 90 tablet 3   methocarbamol (ROBAXIN) 500 MG tablet Take 500 mg by  mouth 4 (four) times daily.     No current facility-administered medications for this visit.    Current Outpatient Medications:    albuterol (VENTOLIN HFA) 108 (90 Base) MCG/ACT inhaler, Inhale 1-2 puffs into the lungs every 6 (six) hours as needed for wheezing or shortness of breath., Disp: 18 g, Rfl: 2   amLODipine (NORVASC) 10 MG tablet, Take 10 mg by mouth daily., Disp: , Rfl:    Budesonide 90 MCG/ACT inhaler, Inhale 1-2 puffs into the lungs 2 (two) times daily., Disp: 1 each, Rfl: 6   HYDROcodone-acetaminophen (NORCO/VICODIN) 5-325 MG tablet, Take 1-2 tablets by mouth., Disp: , Rfl:    lisinopril-hydrochlorothiazide (ZESTORETIC) 20-25 MG tablet, Take 1 tablet by mouth daily., Disp: 30 tablet, Rfl: 1   loratadine (CLARITIN) 10 MG tablet, Take 1 tablet (10 mg total) by mouth at bedtime., Disp: 90 tablet, Rfl: 3   methocarbamol (ROBAXIN) 500 MG tablet, Take 500 mg by mouth 4 (four) times daily., Disp: , Rfl:  No Known Allergies Family History  Problem Relation Age of Onset   Healthy Mother    Colon cancer Sister 49   Hypertension Sister    Hypertension Maternal Grandmother    Breast cancer Maternal Aunt    Hypertension Sister    Esophageal cancer Neg Hx    Inflammatory bowel disease Neg Hx    Liver disease Neg Hx    Pancreatic cancer Neg Hx    Stomach cancer Neg Hx    Social History   Socioeconomic History   Marital status: Single    Spouse  name: Not on file   Number of children: 1   Years of education: Not on file   Highest education level: Not on file  Occupational History   Occupation: CNA  Tobacco Use   Smoking status: Every Day    Packs/day: 1.00    Years: 37.00    Additional pack years: 0.00    Total pack years: 37.00    Types: Cigarettes   Smokeless tobacco: Never  Vaping Use   Vaping Use: Never used  Substance and Sexual Activity   Alcohol use: Yes    Alcohol/week: 21.0 standard drinks of alcohol    Types: 21 Cans of beer per week    Comment: 3 beers per  day   Drug use: Never   Sexual activity: Not Currently    Birth control/protection: Post-menopausal  Other Topics Concern   Not on file  Social History Narrative   Not on file   Social Determinants of Health   Financial Resource Strain: Not on file  Food Insecurity: Not on file  Transportation Needs: Not on file  Physical Activity: Not on file  Stress: Not on file  Social Connections: Not on file  Intimate Partner Violence: Not on file    Physical Exam: There were no vitals filed for this visit. There is no height or weight on file to calculate BMI. GEN: NAD EYE: Sclerae anicteric ENT: MMM CV: Non-tachycardic GI: Soft, NT/ND NEURO:  Alert & Oriented x 3  Lab Results: No results for input(s): "WBC", "HGB", "HCT", "PLT" in the last 72 hours. BMET No results for input(s): "NA", "K", "CL", "CO2", "GLUCOSE", "BUN", "CREATININE", "CALCIUM" in the last 72 hours. LFT No results for input(s): "PROT", "ALBUMIN", "AST", "ALT", "ALKPHOS", "BILITOT", "BILIDIR", "IBILI" in the last 72 hours. PT/INR No results for input(s): "LABPROT", "INR" in the last 72 hours.   Impression / Plan: This is a 57 y.o.female who presents for Colonoscopy for followup/surveillance of previous colon polyps (in 2022 had ICV inflammatory polyp/mass but on repeat Colonsocopy this was improved and resection showed no dysplasia).  The risks and benefits of endoscopic evaluation/treatment were discussed with the patient and/or family; these include but are not limited to the risk of perforation, infection, bleeding, missed lesions, lack of diagnosis, severe illness requiring hospitalization, as well as anesthesia and sedation related illnesses.  The patient's history has been reviewed, patient examined, no change in status, and deemed stable for procedure.  The patient and/or family is agreeable to proceed.    Corliss Parish, MD [facility] Office # [phone]

## 2022-10-24 NOTE — Progress Notes (Signed)
VS completed by AS. ? ?Pt's states no medical or surgical changes since previsit or office visit. ? ?

## 2022-10-24 NOTE — Patient Instructions (Addendum)
Thank you for coming in to see [place] today Resume previous diet, recommending more high fiber foods. Resume previous medications. Recommend adding FiberCon 1-2 tablets daily. Return to regular daily activities tomorrow.   YOU HAD AN ENDOSCOPIC PROCEDURE TODAY AT THE [facility]:   Refer to the procedure report that was given to you for any specific questions about what was found during the examination.  If the procedure report does not answer your questions, please call your gastroenterologist to clarify.  If you requested that your care partner not be given the details of your procedure findings, then the procedure report has been included in a sealed envelope for you to review at your convenience later.  YOU SHOULD EXPECT: Some feelings of bloating in the abdomen. Passage of more gas than usual.  Walking can help get rid of the air that was put into your GI tract during the procedure and reduce the bloating. If you had a lower endoscopy (such as a colonoscopy or flexible sigmoidoscopy) you may notice spotting of blood in your stool or on the toilet paper. If you underwent a bowel prep for your procedure, you may not have a normal bowel movement for a few days.  Please Note:  You might notice some irritation and congestion in your nose or some drainage.  This is from the oxygen used during your procedure.  There is no need for concern and it should clear up in a day or so.  SYMPTOMS TO REPORT IMMEDIATELY:  Following lower endoscopy (colonoscopy or flexible sigmoidoscopy):  Excessive amounts of blood in the stool  Significant tenderness or worsening of abdominal pains  Swelling of the abdomen that is new, acute  Fever of 100F or higher    For urgent or emergent issues, a gastroenterologist can be reached at any hour by calling [phone]. Do not use MyChart messaging for urgent concerns.    DIET:  We do recommend a small meal at first, but then you may proceed to your  regular diet.  Drink plenty of fluids but you should avoid alcoholic beverages for 24 hours.  ACTIVITY:  You should plan to take it easy for the rest of today and you should NOT DRIVE or use heavy machinery until tomorrow (because of the sedation medicines used during the test).    FOLLOW UP: Our staff will call the number listed on your records the next business day following your procedure.  We will call around 7:15- 8:00 am to check on you and address any questions or concerns that you may have regarding the information given to you following your procedure. If we do not reach you, we will leave a message.     If any biopsies were taken you will be contacted by phone or by letter within the next 1-3 weeks.  Please call us at [phone] if you have not heard about the biopsies in 3 weeks.    SIGNATURES/CONFIDENTIALITY: You and/or your care partner have signed paperwork which will be entered into your electronic medical record.  These signatures attest to the fact that that the information above on your After Visit Summary has been reviewed and is understood.  Full responsibility of the confidentiality of this discharge information lies with you and/or your care-partner.

## 2022-10-27 ENCOUNTER — Telehealth: Payer: Self-pay | Admitting: *Deleted

## 2022-10-27 NOTE — Telephone Encounter (Signed)
  Follow up Call-     10/24/2022    2:44 PM  Call back number  Post procedure Call Back phone  # [phone]  Permission to leave phone message Yes    Post procedure follow up phone call. No answer at number given.  Unable to leave a VM

## 2022-11-26 DIAGNOSIS — M65311 Trigger thumb, right thumb: Secondary | ICD-10-CM | POA: Diagnosis not present

## 2022-11-29 ENCOUNTER — Encounter (HOSPITAL_COMMUNITY): Payer: Self-pay

## 2022-11-29 ENCOUNTER — Ambulatory Visit (HOSPITAL_COMMUNITY)
Admission: EM | Admit: 2022-11-29 | Discharge: 2022-11-29 | Disposition: A | Discharge status: Home / Self Care | Payer: Medicaid Other | Attending: Emergency Medicine | Admitting: Emergency Medicine

## 2022-11-29 DIAGNOSIS — L0201 Cutaneous abscess of face: Secondary | ICD-10-CM

## 2022-11-29 MED ORDER — MUPIROCIN 2 % EX OINT: 1.0000 | TOPICAL_OINTMENT | Freq: Two times a day (BID) | CUTANEOUS | 0 refills | Status: AC

## 2022-11-29 MED ORDER — LIDOCAINE-EPINEPHRINE 1 %-1:100000 IJ SOLN
INTRAMUSCULAR | Status: AC
  Filled 2022-11-29: qty 1

## 2022-11-29 MED ORDER — CHLORHEXIDINE GLUCONATE 4 % EX SOLN: Freq: Every day | CUTANEOUS | 0 refills | Status: AC | PRN

## 2022-11-29 MED ORDER — SULFAMETHOXAZOLE-TRIMETHOPRIM 800-160 MG PO TABS: 1.0000 | ORAL_TABLET | Freq: Two times a day (BID) | ORAL | 0 refills | Status: AC
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
## 2022-11-29 NOTE — Discharge Instructions (Addendum)
We drained an abscess to your cheek today.  Please take all antibiotics as prescribed until finished, you can take them with food to prevent gastrointestinal upset.  Please do warm compresses 2-3 times daily with antibacterial solution Hibiclens.  Afterwards please pat dry and use the Bactroban ointment.  You should see overall improvement within the next 72 hours.    If you do not have improvement despite antibiotics, or have any new concerning symptoms, please return to your primary care follow-up with this clinic.
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
## 2022-12-24 DIAGNOSIS — M65311 Trigger thumb, right thumb: Secondary | ICD-10-CM | POA: Diagnosis not present

## 2023-08-05 DIAGNOSIS — J3089 Other allergic rhinitis: Secondary | ICD-10-CM | POA: Diagnosis not present

## 2023-08-05 DIAGNOSIS — I10 Essential (primary) hypertension: Secondary | ICD-10-CM | POA: Diagnosis not present

## 2023-08-05 DIAGNOSIS — M2550 Pain in unspecified joint: Secondary | ICD-10-CM | POA: Diagnosis not present

## 2024-01-10 DIAGNOSIS — I469 Cardiac arrest, cause unspecified: Secondary | ICD-10-CM | POA: Diagnosis not present

## 2024-01-10 DIAGNOSIS — E876 Hypokalemia: Secondary | ICD-10-CM | POA: Diagnosis not present

## 2024-01-10 DIAGNOSIS — G928 Other toxic encephalopathy: Secondary | ICD-10-CM | POA: Diagnosis not present

## 2024-01-10 DIAGNOSIS — J189 Pneumonia, unspecified organism: Secondary | ICD-10-CM | POA: Diagnosis not present

## 2024-01-10 DIAGNOSIS — M96A3 Multiple fractures of ribs associated with chest compression and cardiopulmonary resuscitation: Secondary | ICD-10-CM | POA: Diagnosis not present

## 2024-01-10 DIAGNOSIS — R7989 Other specified abnormal findings of blood chemistry: Secondary | ICD-10-CM | POA: Diagnosis not present

## 2024-01-10 DIAGNOSIS — J9601 Acute respiratory failure with hypoxia: Secondary | ICD-10-CM | POA: Diagnosis not present

## 2024-01-10 DIAGNOSIS — F1513 Other stimulant abuse with withdrawal: Secondary | ICD-10-CM | POA: Diagnosis not present

## 2024-01-10 DIAGNOSIS — R451 Restlessness and agitation: Secondary | ICD-10-CM | POA: Diagnosis not present

## 2024-01-10 DIAGNOSIS — R7401 Elevation of levels of liver transaminase levels: Secondary | ICD-10-CM | POA: Diagnosis not present

## 2024-01-10 DIAGNOSIS — M96A1 Fracture of sternum associated with chest compression and cardiopulmonary resuscitation: Secondary | ICD-10-CM | POA: Diagnosis not present

## 2024-01-10 DIAGNOSIS — F05 Delirium due to known physiological condition: Secondary | ICD-10-CM | POA: Diagnosis not present

## 2024-01-10 DIAGNOSIS — F10139 Alcohol abuse with withdrawal, unspecified: Secondary | ICD-10-CM | POA: Diagnosis not present

## 2024-01-10 DIAGNOSIS — T405X1A Poisoning by cocaine, accidental (unintentional), initial encounter: Secondary | ICD-10-CM | POA: Diagnosis not present

## 2024-01-10 DIAGNOSIS — N179 Acute kidney failure, unspecified: Secondary | ICD-10-CM | POA: Diagnosis not present

## 2024-01-10 DIAGNOSIS — E785 Hyperlipidemia, unspecified: Secondary | ICD-10-CM | POA: Diagnosis not present

## 2024-01-10 DIAGNOSIS — F1413 Cocaine abuse, unspecified with withdrawal: Secondary | ICD-10-CM | POA: Diagnosis not present

## 2024-01-10 DIAGNOSIS — R412 Retrograde amnesia: Secondary | ICD-10-CM | POA: Diagnosis not present

## 2024-01-10 DIAGNOSIS — R2681 Unsteadiness on feet: Secondary | ICD-10-CM | POA: Diagnosis not present

## 2024-01-10 DIAGNOSIS — Z781 Physical restraint status: Secondary | ICD-10-CM | POA: Diagnosis not present

## 2024-01-10 DIAGNOSIS — E872 Acidosis, unspecified: Secondary | ICD-10-CM | POA: Diagnosis not present

## 2024-01-10 DIAGNOSIS — A419 Sepsis, unspecified organism: Secondary | ICD-10-CM | POA: Diagnosis not present

## 2024-01-10 DIAGNOSIS — I21A1 Myocardial infarction type 2: Secondary | ICD-10-CM | POA: Diagnosis not present

## 2024-01-10 DIAGNOSIS — I5032 Chronic diastolic (congestive) heart failure: Secondary | ICD-10-CM | POA: Diagnosis not present

## 2024-01-10 DIAGNOSIS — I110 Hypertensive heart disease with heart failure: Secondary | ICD-10-CM | POA: Diagnosis not present

## 2024-01-10 DIAGNOSIS — Z4682 Encounter for fitting and adjustment of non-vascular catheter: Secondary | ICD-10-CM | POA: Diagnosis not present

## 2024-01-10 DIAGNOSIS — I468 Cardiac arrest due to other underlying condition: Secondary | ICD-10-CM | POA: Diagnosis not present

## 2024-01-10 DIAGNOSIS — J4489 Other specified chronic obstructive pulmonary disease: Secondary | ICD-10-CM | POA: Diagnosis not present

## 2024-01-10 DIAGNOSIS — K56609 Unspecified intestinal obstruction, unspecified as to partial versus complete obstruction: Secondary | ICD-10-CM | POA: Diagnosis not present

## 2024-01-11 DIAGNOSIS — J811 Chronic pulmonary edema: Secondary | ICD-10-CM | POA: Diagnosis not present

## 2024-01-11 DIAGNOSIS — Z4682 Encounter for fitting and adjustment of non-vascular catheter: Secondary | ICD-10-CM | POA: Diagnosis not present

## 2024-01-11 DIAGNOSIS — R918 Other nonspecific abnormal finding of lung field: Secondary | ICD-10-CM | POA: Diagnosis not present

## 2024-01-12 DIAGNOSIS — I469 Cardiac arrest, cause unspecified: Secondary | ICD-10-CM | POA: Diagnosis not present

## 2024-01-13 DIAGNOSIS — R918 Other nonspecific abnormal finding of lung field: Secondary | ICD-10-CM | POA: Diagnosis not present

## 2024-01-13 DIAGNOSIS — I469 Cardiac arrest, cause unspecified: Secondary | ICD-10-CM | POA: Diagnosis not present

## 2024-01-14 DIAGNOSIS — I469 Cardiac arrest, cause unspecified: Secondary | ICD-10-CM | POA: Diagnosis not present

## 2024-01-14 DIAGNOSIS — J90 Pleural effusion, not elsewhere classified: Secondary | ICD-10-CM | POA: Diagnosis not present

## 2024-01-15 DIAGNOSIS — I469 Cardiac arrest, cause unspecified: Secondary | ICD-10-CM | POA: Diagnosis not present

## 2024-01-16 DIAGNOSIS — I469 Cardiac arrest, cause unspecified: Secondary | ICD-10-CM | POA: Diagnosis not present

## 2024-01-17 DIAGNOSIS — I469 Cardiac arrest, cause unspecified: Secondary | ICD-10-CM | POA: Diagnosis not present

## 2024-01-18 DIAGNOSIS — I469 Cardiac arrest, cause unspecified: Secondary | ICD-10-CM | POA: Diagnosis not present

## 2024-01-19 DIAGNOSIS — I469 Cardiac arrest, cause unspecified: Secondary | ICD-10-CM | POA: Diagnosis not present

## 2024-01-20 DIAGNOSIS — I469 Cardiac arrest, cause unspecified: Secondary | ICD-10-CM | POA: Diagnosis not present

## 2024-01-21 DIAGNOSIS — I469 Cardiac arrest, cause unspecified: Secondary | ICD-10-CM | POA: Diagnosis not present
